# Patient Record
Sex: Female | Born: 1975 | Race: Black or African American | Hispanic: No | Marital: Married | Smoking: Current some day smoker
Health system: Southern US, Community
[De-identification: ages and names within clinical notes are randomized; demographics above are authoritative.]

## PROBLEM LIST (undated history)

## (undated) DIAGNOSIS — K219 Gastro-esophageal reflux disease without esophagitis: Secondary | ICD-10-CM

## (undated) DIAGNOSIS — M549 Dorsalgia, unspecified: Secondary | ICD-10-CM

## (undated) DIAGNOSIS — S82892A Other fracture of left lower leg, initial encounter for closed fracture: Secondary | ICD-10-CM

## (undated) HISTORY — PX: UPPER GI ENDOSCOPY: SHX6162

## (undated) HISTORY — PX: TUBAL LIGATION: SHX77

## (undated) HISTORY — PX: SHOULDER SURGERY: SHX246

## (undated) HISTORY — PX: ABDOMINAL EXPLORATION SURGERY: SHX538

---

## 1999-06-25 ENCOUNTER — Inpatient Hospital Stay (HOSPITAL_COMMUNITY): Admission: AD | Admit: 1999-06-25 | Discharge: 1999-06-25 | Payer: Self-pay | Admitting: *Deleted

## 1999-06-26 ENCOUNTER — Inpatient Hospital Stay (HOSPITAL_COMMUNITY): Admission: AD | Admit: 1999-06-26 | Discharge: 1999-06-26 | Payer: Self-pay | Admitting: *Deleted

## 2000-01-22 ENCOUNTER — Inpatient Hospital Stay (HOSPITAL_COMMUNITY): Admission: AD | Admit: 2000-01-22 | Discharge: 2000-01-22 | Payer: Self-pay | Admitting: *Deleted

## 2000-11-04 ENCOUNTER — Encounter: Payer: Self-pay | Admitting: Internal Medicine

## 2001-01-12 ENCOUNTER — Other Ambulatory Visit: Admission: RE | Admit: 2001-01-12 | Discharge: 2001-01-12 | Payer: Self-pay | Admitting: Obstetrics and Gynecology

## 2002-07-09 ENCOUNTER — Inpatient Hospital Stay (HOSPITAL_COMMUNITY): Admission: AD | Admit: 2002-07-09 | Discharge: 2002-07-09 | Payer: Self-pay | Admitting: Family Medicine

## 2002-12-21 ENCOUNTER — Emergency Department (HOSPITAL_COMMUNITY): Admission: EM | Admit: 2002-12-21 | Discharge: 2002-12-21 | Payer: Self-pay | Admitting: *Deleted

## 2004-04-28 ENCOUNTER — Emergency Department (HOSPITAL_COMMUNITY): Admission: EM | Admit: 2004-04-28 | Discharge: 2004-04-28 | Payer: Self-pay | Admitting: Emergency Medicine

## 2004-06-18 ENCOUNTER — Encounter: Payer: Self-pay | Admitting: Internal Medicine

## 2004-12-25 ENCOUNTER — Other Ambulatory Visit: Admission: RE | Admit: 2004-12-25 | Discharge: 2004-12-25 | Payer: Self-pay | Admitting: Gynecology

## 2005-07-05 ENCOUNTER — Emergency Department (HOSPITAL_COMMUNITY): Admission: EM | Admit: 2005-07-05 | Discharge: 2005-07-05 | Payer: Self-pay | Admitting: Family Medicine

## 2006-01-20 ENCOUNTER — Emergency Department (HOSPITAL_COMMUNITY): Admission: EM | Admit: 2006-01-20 | Discharge: 2006-01-20 | Payer: Self-pay | Admitting: Emergency Medicine

## 2006-05-14 ENCOUNTER — Other Ambulatory Visit: Admission: RE | Admit: 2006-05-14 | Discharge: 2006-05-14 | Payer: Self-pay | Admitting: Gynecology

## 2006-08-20 ENCOUNTER — Ambulatory Visit: Payer: Self-pay | Admitting: Family Medicine

## 2007-02-05 ENCOUNTER — Emergency Department (HOSPITAL_COMMUNITY): Admission: EM | Admit: 2007-02-05 | Discharge: 2007-02-05 | Payer: Self-pay | Admitting: Emergency Medicine

## 2007-06-03 ENCOUNTER — Other Ambulatory Visit: Admission: RE | Admit: 2007-06-03 | Discharge: 2007-06-03 | Payer: Self-pay | Admitting: Obstetrics and Gynecology

## 2007-06-17 ENCOUNTER — Ambulatory Visit (HOSPITAL_COMMUNITY): Admission: RE | Admit: 2007-06-17 | Discharge: 2007-06-17 | Payer: Self-pay | Admitting: Obstetrics and Gynecology

## 2007-12-30 ENCOUNTER — Ambulatory Visit: Payer: Self-pay | Admitting: Family Medicine

## 2007-12-30 ENCOUNTER — Encounter (INDEPENDENT_AMBULATORY_CARE_PROVIDER_SITE_OTHER): Payer: Self-pay | Admitting: Family Medicine

## 2007-12-30 ENCOUNTER — Encounter (INDEPENDENT_AMBULATORY_CARE_PROVIDER_SITE_OTHER): Payer: Self-pay | Admitting: *Deleted

## 2007-12-30 DIAGNOSIS — K219 Gastro-esophageal reflux disease without esophagitis: Secondary | ICD-10-CM | POA: Insufficient documentation

## 2007-12-30 LAB — CONVERTED CEMR LAB
Basophils Absolute: 0 10*3/uL (ref 0.0–0.1)
Basophils Relative: 0.4 % (ref 0.0–1.0)
Direct LDL: 131.7 mg/dL
Eosinophils Absolute: 0.2 10*3/uL (ref 0.0–0.6)
Eosinophils Relative: 2.3 % (ref 0.0–5.0)
HCT: 42.9 % (ref 36.0–46.0)
Hemoglobin: 14.1 g/dL (ref 12.0–15.0)
MCHC: 32.9 g/dL (ref 30.0–36.0)
RBC: 4.54 M/uL (ref 3.87–5.11)
RDW: 11.8 % (ref 11.5–14.6)
TSH: 1.26 microintl units/mL (ref 0.35–5.50)
Total CHOL/HDL Ratio: 4.3
Triglycerides: 70 mg/dL (ref 0–149)
VLDL: 14 mg/dL (ref 0–40)
WBC: 6.6 10*3/uL (ref 4.5–10.5)

## 2008-01-05 ENCOUNTER — Telehealth (INDEPENDENT_AMBULATORY_CARE_PROVIDER_SITE_OTHER): Payer: Self-pay | Admitting: *Deleted

## 2008-01-11 ENCOUNTER — Other Ambulatory Visit: Admission: RE | Admit: 2008-01-11 | Discharge: 2008-01-11 | Payer: Self-pay | Admitting: Family Medicine

## 2008-01-11 ENCOUNTER — Encounter (INDEPENDENT_AMBULATORY_CARE_PROVIDER_SITE_OTHER): Payer: Self-pay | Admitting: Family Medicine

## 2008-01-11 ENCOUNTER — Ambulatory Visit: Payer: Self-pay | Admitting: Family Medicine

## 2008-01-11 DIAGNOSIS — R1310 Dysphagia, unspecified: Secondary | ICD-10-CM | POA: Insufficient documentation

## 2008-01-13 ENCOUNTER — Telehealth (INDEPENDENT_AMBULATORY_CARE_PROVIDER_SITE_OTHER): Payer: Self-pay | Admitting: *Deleted

## 2008-01-29 ENCOUNTER — Emergency Department (HOSPITAL_COMMUNITY): Admission: EM | Admit: 2008-01-29 | Discharge: 2008-01-29 | Payer: Self-pay | Admitting: Family Medicine

## 2008-02-10 ENCOUNTER — Ambulatory Visit: Payer: Self-pay | Admitting: Internal Medicine

## 2008-04-12 ENCOUNTER — Ambulatory Visit: Payer: Self-pay | Admitting: Family Medicine

## 2008-04-26 DIAGNOSIS — F17200 Nicotine dependence, unspecified, uncomplicated: Secondary | ICD-10-CM

## 2008-04-26 DIAGNOSIS — E785 Hyperlipidemia, unspecified: Secondary | ICD-10-CM

## 2008-04-27 ENCOUNTER — Ambulatory Visit: Payer: Self-pay | Admitting: Internal Medicine

## 2008-05-03 ENCOUNTER — Telehealth (INDEPENDENT_AMBULATORY_CARE_PROVIDER_SITE_OTHER): Payer: Self-pay | Admitting: *Deleted

## 2008-05-09 ENCOUNTER — Telehealth (INDEPENDENT_AMBULATORY_CARE_PROVIDER_SITE_OTHER): Payer: Self-pay | Admitting: *Deleted

## 2008-05-10 ENCOUNTER — Ambulatory Visit: Payer: Self-pay | Admitting: Internal Medicine

## 2008-05-10 ENCOUNTER — Encounter: Payer: Self-pay | Admitting: Family Medicine

## 2008-05-10 DIAGNOSIS — J019 Acute sinusitis, unspecified: Secondary | ICD-10-CM

## 2008-05-16 ENCOUNTER — Telehealth (INDEPENDENT_AMBULATORY_CARE_PROVIDER_SITE_OTHER): Payer: Self-pay | Admitting: *Deleted

## 2008-05-19 ENCOUNTER — Telehealth (INDEPENDENT_AMBULATORY_CARE_PROVIDER_SITE_OTHER): Payer: Self-pay | Admitting: *Deleted

## 2008-07-29 ENCOUNTER — Ambulatory Visit: Payer: Self-pay | Admitting: Family Medicine

## 2008-07-29 ENCOUNTER — Encounter (INDEPENDENT_AMBULATORY_CARE_PROVIDER_SITE_OTHER): Payer: Self-pay | Admitting: *Deleted

## 2008-07-29 DIAGNOSIS — J029 Acute pharyngitis, unspecified: Secondary | ICD-10-CM

## 2008-07-30 ENCOUNTER — Inpatient Hospital Stay (HOSPITAL_COMMUNITY): Admission: AD | Admit: 2008-07-30 | Discharge: 2008-07-30 | Payer: Self-pay | Admitting: Obstetrics and Gynecology

## 2008-07-30 ENCOUNTER — Encounter: Payer: Self-pay | Admitting: Family Medicine

## 2008-08-02 ENCOUNTER — Encounter (INDEPENDENT_AMBULATORY_CARE_PROVIDER_SITE_OTHER): Payer: Self-pay | Admitting: *Deleted

## 2009-02-01 ENCOUNTER — Inpatient Hospital Stay (HOSPITAL_COMMUNITY): Admission: AD | Admit: 2009-02-01 | Discharge: 2009-02-04 | Payer: Self-pay | Admitting: Obstetrics & Gynecology

## 2009-02-06 ENCOUNTER — Inpatient Hospital Stay (HOSPITAL_COMMUNITY): Admission: AD | Admit: 2009-02-06 | Discharge: 2009-02-06 | Payer: Self-pay | Admitting: Obstetrics and Gynecology

## 2009-02-14 ENCOUNTER — Emergency Department (HOSPITAL_COMMUNITY): Admission: EM | Admit: 2009-02-14 | Discharge: 2009-02-15 | Payer: Self-pay | Admitting: Emergency Medicine

## 2009-07-03 ENCOUNTER — Emergency Department (HOSPITAL_COMMUNITY): Admission: EM | Admit: 2009-07-03 | Discharge: 2009-07-03 | Payer: Self-pay | Admitting: Emergency Medicine

## 2009-07-17 ENCOUNTER — Emergency Department (HOSPITAL_COMMUNITY): Admission: EM | Admit: 2009-07-17 | Discharge: 2009-07-18 | Payer: Self-pay | Admitting: Emergency Medicine

## 2009-12-22 ENCOUNTER — Emergency Department (HOSPITAL_COMMUNITY): Admission: EM | Admit: 2009-12-22 | Discharge: 2009-12-22 | Payer: Self-pay | Admitting: Emergency Medicine

## 2010-05-29 ENCOUNTER — Ambulatory Visit: Payer: Self-pay | Admitting: Nurse Practitioner

## 2010-05-29 ENCOUNTER — Inpatient Hospital Stay (HOSPITAL_COMMUNITY): Admission: AD | Admit: 2010-05-29 | Discharge: 2010-05-30 | Payer: Self-pay | Admitting: Obstetrics and Gynecology

## 2010-06-09 ENCOUNTER — Ambulatory Visit: Payer: Self-pay | Admitting: Nurse Practitioner

## 2010-06-09 ENCOUNTER — Inpatient Hospital Stay (HOSPITAL_COMMUNITY): Admission: AD | Admit: 2010-06-09 | Discharge: 2010-06-09 | Payer: Self-pay | Admitting: Obstetrics and Gynecology

## 2010-06-28 ENCOUNTER — Inpatient Hospital Stay (HOSPITAL_COMMUNITY): Admission: AD | Admit: 2010-06-28 | Discharge: 2010-06-28 | Payer: Self-pay | Admitting: Obstetrics and Gynecology

## 2010-12-12 ENCOUNTER — Inpatient Hospital Stay (HOSPITAL_COMMUNITY)
Admission: AD | Admit: 2010-12-12 | Discharge: 2010-12-12 | Payer: Self-pay | Source: Home / Self Care | Attending: Obstetrics and Gynecology | Admitting: Obstetrics and Gynecology

## 2010-12-25 LAB — CBC
HCT: 35.8 % — ABNORMAL LOW (ref 36.0–46.0)
Hemoglobin: 12.5 g/dL (ref 12.0–15.0)
MCHC: 34.9 g/dL (ref 30.0–36.0)
RBC: 3.85 MIL/uL — ABNORMAL LOW (ref 3.87–5.11)

## 2010-12-25 LAB — SURGICAL PCR SCREEN
MRSA, PCR: NEGATIVE
Staphylococcus aureus: NEGATIVE

## 2011-01-01 ENCOUNTER — Other Ambulatory Visit: Payer: Self-pay | Admitting: Obstetrics and Gynecology

## 2011-01-01 ENCOUNTER — Inpatient Hospital Stay (HOSPITAL_COMMUNITY)
Admission: RE | Admit: 2011-01-01 | Discharge: 2011-01-04 | DRG: 766 | Disposition: A | Discharge status: Home / Self Care | Payer: Medicaid Other | Source: Ambulatory Visit | Attending: Obstetrics and Gynecology | Admitting: Obstetrics and Gynecology

## 2011-01-01 DIAGNOSIS — O34219 Maternal care for unspecified type scar from previous cesarean delivery: Principal | ICD-10-CM | POA: Diagnosis present

## 2011-01-01 DIAGNOSIS — Z302 Encounter for sterilization: Secondary | ICD-10-CM

## 2011-01-01 DIAGNOSIS — O99893 Other specified diseases and conditions complicating puerperium: Secondary | ICD-10-CM | POA: Diagnosis not present

## 2011-01-01 DIAGNOSIS — R51 Headache: Secondary | ICD-10-CM | POA: Diagnosis not present

## 2011-01-02 LAB — CBC
MCH: 31.6 pg (ref 26.0–34.0)
MCV: 95.2 fL (ref 78.0–100.0)
Platelets: 148 10*3/uL — ABNORMAL LOW (ref 150–400)
RBC: 3.51 MIL/uL — ABNORMAL LOW (ref 3.87–5.11)

## 2011-01-17 NOTE — Discharge Summary (Signed)
  [name], [name]                  ACCOUNT NO.:  [id number]  MEDICAL RECORD NO.:  [id number]           PATIENT TYPE:  I  LOCATION:  9121                          [facility]  PHYSICIAN:  Malva Limes, M.D.    DATE OF BIRTH:  December 15, 1975  DATE OF ADMISSION:  01/01/2011 DATE OF DISCHARGE:  01/04/2011                              DISCHARGE SUMMARY   PRINCIPAL DISCHARGE DIAGNOSES: 1. Intrauterine pregnancy at term. 2. History of prior cesarean section. 3. The patient desires repeat cesarean section. 4. The patient desires permanent sterilization. 5. Possible spinal headache.  PRINCIPAL PROCEDURES: 1. Repeat low transverse cesarean section. 2. Bilateral tubal ligation. 3. Anesthesia consult.  HISTORY OF PRESENT ILLNESS:  [name] is a 35 year old black female, G2, P1, with a history of prior cesarean section.  She expressed a desire for repeat cesarean section and permanent sterilization.  This was performed on January 01, 2011.  Procedure was uncomplicated. Complete description of this can be found in dictated operative note. The pathology on the tubes is pending.  The patient's preop hemoglobin was 12.5, postop 11.1 during the postop.  The patient complained of headache.  She also had this with her first delivery.  An anesthesia consult was obtained.  It is felt that she possibly had a spinal headache and recommendations from Anesthesia were increasing fluids, increasing caffeine intake, resting supine, and Fioricet p.r.n.  At the day of discharge, the patient's headache was slightly improved.  She did not have a bowel movement prior to discharge.  She was eating without difficulty and passing flatus.  The patient was recommended to have Dulcolax suppository prior to discharge.  She was sent home with Percocet and Fioricet to take p.r.n.  She was instructed to follow up in the office in 4 weeks.  Her incision was healing well at this time of discharge.     ______________________________ Malva Limes, M.D.     MA/MEDQ  D:  01/04/2011  T:  01/04/2011  Job:  [id number]  Electronically Signed by Malva Limes M.D. on 01/17/2011 12:47:38 PM

## 2011-01-17 NOTE — Op Note (Signed)
NAMEJOLIE, Regina Simmons                  ACCOUNT NO.:  [id number]  MEDICAL RECORD NO.:  [id number]           PATIENT TYPE:  I  LOCATION:  9121                          [facility]  PHYSICIAN:  Malva Limes, M.D.    DATE OF BIRTH:  [birth year]  DATE OF PROCEDURE:  01/01/2011 DATE OF DISCHARGE:                              OPERATIVE REPORT   PREOPERATIVE DIAGNOSES: 1. Intrauterine pregnancy at term. 2. History of prior cesarean section. 3. The patient desires repeat cesarean section. 4. The patient desires permanent sterilization.  POSTOPERATIVE DIAGNOSES: 1. Intrauterine pregnancy at term. 2. History of prior cesarean section. 3. The patient desires repeat cesarean section. 4. The patient desires permanent sterilization.  PROCEDURES: 1. Repeat low transverse cesarean section. 2. Bilateral tubal ligation, Pomeroy procedure.  SURGEON:  Malva Limes, MD  ASSISTANT:  Luvenia Redden, MD  ANESTHESIA:  Spinal.  ANTIBIOTIC:  Gentamicin and clindamycin.  ESTIMATED BLOOD LOSS:  900 mL.  DRAINS:  Foley bedside drainage.  SPECIMENS:  Portion of right and left fallopian tube sent to Pathology.  COMPLICATIONS:  None.  INDICATIONS:  Regina Simmons is a 35 year old black female G2, P1, at 80 weeks' estimated gestational age who expressed desire for repeat cesarean section and bilateral tubal ligation.  The patient's prenatal care was uncomplicated prior to having a procedure performed.  The patient expressed her understanding of the intended permanence of the procedure, the possible failure rate and possible change in her menstrual cycles.  PROCEDURE:  The patient was taken to the operating room where spinal anesthetic was administered without difficulty.  She was then placed in the dorsal supine position with left lateral tilt.  She was prepped and draped in the usual fashion for this procedure.  A Foley catheter was placed in her bladder.  A Pfannenstiel incision was made through  the previous scar.  On entering the abdominal cavity, the bladder flap was taken down with sharp dissection.  A low-transverse uterine incision was made in the midline and extended laterally with blunt dissection. Amniotic sac was entered.  The fluid was clear.  The infant was delivered in the vertex presentation with vacuum extractor.  On delivery of the head, the oropharynx and nostrils were bulb suctioned.  The cord doubly clamped and cut and the infant handed to the awaiting NICU team. Placenta was then manually removed.  The uterus was exteriorized. Uterine cavity was examined and wiped with wet lap.  The uterine incision was closed in a single layer of 0 Monocryl suture in a running locking fashion.  The right fallopian tube was then grasped in the isthmic portion.  A 3-cm knuckle of fallopian tube was then doubly tied with 2-0 plain gut suture.  The knuckle was excised.  Both ostia were visualized.  Hemostasis appeared to be adequate.  A similar procedure was performed on the opposite side.  The uterus was placed back into the abdominal cavity.  Hemostasis again was checked and felt to be adequate. The bladder flap was closed using 2-0 Monocryl in a running fashion. The parietal peritoneum and rectus muscles were approximated in the midline  using 2-0 Monocryl in a running fashion.  The fascia was closed using 0 Monocryl suture in running fashion.  The subcuticular tissue was made hemostatic with a Bovie.  Stainless steel clips were used to close the skin.  The patient tolerated the procedure well.  She was taken to recovery room in stable condition.  Instrument and lap counts were correct x2.  The patient delivered one live viable black female infant, Apgars 9 at one minute and 9 at five minutes.  The weight was 7 pounds 7 ounces.          ______________________________ Malva Limes, M.D.     MA/MEDQ  D:  01/01/2011  T:  01/02/2011  Job:  [id number]  Electronically Signed by  Malva Limes M.D. on 01/17/2011 12:47:43 PM

## 2011-02-08 LAB — GLUCOSE, CAPILLARY: Glucose-Capillary: 86 mg/dL (ref 70–99)

## 2011-02-08 LAB — URINALYSIS, ROUTINE W REFLEX MICROSCOPIC
Bilirubin Urine: NEGATIVE
Glucose, UA: 250 mg/dL — AB
Ketones, ur: 15 mg/dL — AB
Nitrite: NEGATIVE
Urobilinogen, UA: 1 mg/dL (ref 0.0–1.0)

## 2011-02-08 LAB — URINE MICROSCOPIC-ADD ON

## 2011-02-09 LAB — URINALYSIS, ROUTINE W REFLEX MICROSCOPIC
Ketones, ur: NEGATIVE mg/dL
Nitrite: NEGATIVE
Protein, ur: NEGATIVE mg/dL
pH: 5.5 (ref 5.0–8.0)

## 2011-02-09 LAB — WET PREP, GENITAL: Yeast Wet Prep HPF POC: NONE SEEN

## 2011-02-09 LAB — GC/CHLAMYDIA PROBE AMP, GENITAL
Chlamydia, DNA Probe: NEGATIVE
GC Probe Amp, Genital: NEGATIVE

## 2011-02-10 LAB — CBC
HCT: 37.8 % (ref 36.0–46.0)
MCH: 33.2 pg (ref 26.0–34.0)
MCV: 96.6 fL (ref 78.0–100.0)
RDW: 12.6 % (ref 11.5–15.5)
WBC: 9.4 10*3/uL (ref 4.0–10.5)

## 2011-02-10 LAB — HCG, QUANTITATIVE, PREGNANCY: hCG, Beta Chain, Quant, S: 143537 m[IU]/mL — ABNORMAL HIGH (ref <5)

## 2011-02-10 LAB — WET PREP, GENITAL
Trich, Wet Prep: NONE SEEN
Yeast Wet Prep HPF POC: NONE SEEN

## 2011-02-10 LAB — URINALYSIS, ROUTINE W REFLEX MICROSCOPIC
Bilirubin Urine: NEGATIVE
Hgb urine dipstick: NEGATIVE
Ketones, ur: NEGATIVE mg/dL
Nitrite: NEGATIVE
Protein, ur: NEGATIVE mg/dL
Specific Gravity, Urine: 1.025 (ref 1.005–1.030)
Urobilinogen, UA: 1 mg/dL (ref 0.0–1.0)

## 2011-02-10 LAB — GC/CHLAMYDIA PROBE AMP, GENITAL: GC Probe Amp, Genital: NEGATIVE

## 2011-02-12 ENCOUNTER — Other Ambulatory Visit: Payer: Self-pay | Admitting: Obstetrics and Gynecology

## 2011-03-02 LAB — URINE MICROSCOPIC-ADD ON

## 2011-03-02 LAB — BASIC METABOLIC PANEL
BUN: 13 mg/dL (ref 6–23)
Calcium: 9.4 mg/dL (ref 8.4–10.5)
GFR calc non Af Amer: >60 mL/min (ref >60)
Glucose, Bld: 89 mg/dL (ref 70–99)

## 2011-03-02 LAB — URINALYSIS, ROUTINE W REFLEX MICROSCOPIC
Glucose, UA: NEGATIVE mg/dL
Ketones, ur: 15 mg/dL — AB
Leukocytes, UA: NEGATIVE
Nitrite: NEGATIVE
Specific Gravity, Urine: 1.039 — ABNORMAL HIGH (ref 1.005–1.030)
pH: 5.5 (ref 5.0–8.0)

## 2011-03-02 LAB — CBC
Platelets: 276 10*3/uL (ref 150–400)
RDW: 13.1 % (ref 11.5–15.5)
WBC: 9.9 10*3/uL (ref 4.0–10.5)

## 2011-03-02 LAB — DIFFERENTIAL
Basophils Absolute: 0 10*3/uL (ref 0.0–0.1)
Lymphocytes Relative: 26 % (ref 12–46)
Lymphs Abs: 2.6 10*3/uL (ref 0.7–4.0)
Neutro Abs: 5.9 10*3/uL (ref 1.7–7.7)
Neutrophils Relative %: 59 % (ref 43–77)

## 2011-03-02 LAB — POCT PREGNANCY, URINE: Preg Test, Ur: NEGATIVE

## 2011-03-02 LAB — RAPID STREP SCREEN (MED CTR MEBANE ONLY): Streptococcus, Group A Screen (Direct): NEGATIVE

## 2011-03-07 LAB — COMPREHENSIVE METABOLIC PANEL
Alkaline Phosphatase: 76 U/L (ref 39–117)
BUN: 8 mg/dL (ref 6–23)
Creatinine, Ser: 0.68 mg/dL (ref 0.4–1.2)
Glucose, Bld: 95 mg/dL (ref 70–99)
Potassium: 3.8 mEq/L (ref 3.5–5.1)
Total Bilirubin: 0.3 mg/dL (ref 0.3–1.2)
Total Protein: 5.2 g/dL — ABNORMAL LOW (ref 6.0–8.3)

## 2011-03-07 LAB — CBC
HCT: 25.4 % — ABNORMAL LOW (ref 36.0–46.0)
HCT: 26.9 % — ABNORMAL LOW (ref 36.0–46.0)
HCT: 37.5 % (ref 36.0–46.0)
Hemoglobin: 12.6 g/dL (ref 12.0–15.0)
Hemoglobin: 8.7 g/dL — ABNORMAL LOW (ref 12.0–15.0)
Hemoglobin: 9.2 g/dL — ABNORMAL LOW (ref 12.0–15.0)
MCV: 97.3 fL (ref 78.0–100.0)
Platelets: 162 10*3/uL (ref 150–400)
RBC: 2.73 MIL/uL — ABNORMAL LOW (ref 3.87–5.11)
RBC: 3.82 MIL/uL — ABNORMAL LOW (ref 3.87–5.11)
RDW: 12.6 % (ref 11.5–15.5)
RDW: 13.3 % (ref 11.5–15.5)
WBC: 12.4 10*3/uL — ABNORMAL HIGH (ref 4.0–10.5)
WBC: 17.8 10*3/uL — ABNORMAL HIGH (ref 4.0–10.5)

## 2011-03-07 LAB — URIC ACID: Uric Acid, Serum: 6.1 mg/dL (ref 2.4–7.0)

## 2011-03-07 LAB — LACTATE DEHYDROGENASE: LDH: 183 U/L (ref 94–250)

## 2011-03-07 LAB — CCBB MATERNAL DONOR DRAW

## 2011-04-09 NOTE — Op Note (Signed)
Regina Simmons, Regina Simmons                  ACCOUNT NO.:  [id number]   MEDICAL RECORD NO.:  [id number]          PATIENT TYPE:  INP   LOCATION:  9133                          [facility]   PHYSICIAN:  Malva Limes, M.D.    DATE OF BIRTH:  Feb 21, 1976   DATE OF PROCEDURE:  02/01/2009  DATE OF DISCHARGE:                               OPERATIVE REPORT   PREOPERATIVE DIAGNOSES:  1. Intrauterine pregnancy at term.  2. Failure to progress.   POSTOPERATIVE DIAGNOSES:  1. Intrauterine pregnancy at term.  2. Failure to progress.   PROCEDURE:  Primary low transverse cesarean section.   SURGEON:  Malva Limes, MD   ANESTHESIA:  Spinal.   ANTIBIOTIC:  Ancef 1 g.   DRAINS:  Foley bedside drainage.   ESTIMATED BLOOD LOSS:  900 mL.   SPECIMENS:  None.   COMPLICATIONS:  None.   FINDINGS:  The patient had normal fallopian tubes bilaterally.  She had  approximately 3-4 uterine leiomyomata, largest measuring approximately  2.5-3 cm.  These were scattered about the uterus.   PROCEDURE:  The patient was taken to the operating room where an  adequate level was reached by anesthesia.  Once this was accomplished,  the patient was prepped and draped in the usual fashion for this  procedure.  A Pfannenstiel incision was made approximately 2 cm above  the pubic symphysis.  On entering the abdominal cavity, the bladder flap  was taken down sharply.  A low transverse uterine incision was made in  the midline and extended laterally with blunt dissection.  Amniotic  fluid was noted to be clear.  The infant was delivered in the vertex  presentation.  The oropharynx and nostrils were bulb suctioned on  delivery of the head.  The remaining infant was then delivered.  The  cord was doubly clamped and cut and the infant handed to awaiting NICU  team.  Placenta was manually removed.  The uterus was exteriorized.  The  uterine cavity was cleaned with wet lap and inspected.  The uterine  incision was closed in  a single layer of 0 Monocryl suture in a running  locking fashion.  There was an area of bleeding at the right corner  which was made hemostatic with interrupted 0 Monocryl suture in a figure-  of-eight fashion.  Bladder flap was closed using 0 Monocryl suture in a  running fashion.  The uterus was placed back in the abdominal cavity.  Hemostasis was again checked and felt to be adequate.  The parietal  peritoneum and rectus muscles were approximated in the midline with 2-0  Monocryl in a running fashion.  The fascia was closed using 0  Monocryl suture in a running fashion.  Subcuticular tissue was made  hemostatic with Bovie.  Stainless steel clips were used to close the  skin.  The patient tolerated the procedure well.  She was taken to the  recovery room in stable condition.  Instrument and lap counts were  correct x2.           ______________________________  Malva Limes, M.D.  MA/MEDQ  D:  02/01/2009  T:  02/02/2009  Job:  [id number]

## 2011-04-09 NOTE — H&P (Signed)
NAME:  Regina Simmons, Regina Simmons                  ACCOUNT NO.:  [id number]   MEDICAL RECORD NO.:  [id number]          PATIENT TYPE:  AMB   LOCATION:  SDC                           [facility]   PHYSICIAN:  James A. Ashley Royalty, M.D.DATE OF BIRTH:  1976-02-27   DATE OF ADMISSION:  06/17/2007  DATE OF DISCHARGE:                              HISTORY & PHYSICAL   This is a 35 year old nulligravida who presented to me June 03, 2007,  complaining of lower abdominal pain, right greater than left.  Onset was  4 years ago.  The pain was intermittent.  It seems to last less than an  hour at a time.  There were no associated symptoms.  She also complains  of pain on the same side during sexual activity.  She has a long history  of moderate dysmenorrhea.  She has also had unprotected intercourse for  greater than one year without conception.  She states she has a history  of pelvic inflammatory disease at or about 35 years of age.  She was  documented as having chlamydia, which was treated.  The patient states  the discomfort is sufficiently debilitating to warrant surgical  intervention.   MEDICATIONS:  Protonix.   PAST MEDICAL HISTORY:  Medical:  Left shoulder surgery.   ALLERGIES:  None disclosed.   SOCIAL HISTORY:  The patient smokes approximately 3 cigarettes per day.  She denies significant use of alcohol.   REVIEW OF SYSTEMS:  Noncontributory.   PHYSICAL EXAMINATION:  A well-developed, well-nourished, pleasant female  in no acute distress.  Afebrile.  Vital signs stable.  CHEST:  Lungs are clear.  CARDIAC:  Regular rate and rhythm.  ABDOMEN:  Soft and nontender.  PELVIC:  Please see most recent examination.  External genitalia within  normal limits.  Vagina and cervix are without gross lesions.  Bimanual  examination reveals the uterus to be normal size and shape except for  nodularity in the rectovaginal septum versus posterior aspect of the  uterus.   IMPRESSION:  1. Right lower quadrant  pain of 4 years' duration, intermittent.      Differential includes endometriosis, primary gastrointestinal,      adnexal pathology, musculoskeletal, etc.  2. Dyspareunia.  Differential includes adhesions, endometriosis.  3. Smoker.   PLAN:  Diagnostic/operative laparoscopy.  The risks, benefits,  complications and alternatives were fully discussed with the patient.  The possibility of unilateral salpingo-oophorectomy discussed and  accepted.  The possibility of exploratory laparotomy discussed and  accepted.  Questions invited and answered.      James A. Ashley Royalty, M.D.  Electronically Signed     JAM/MEDQ  D:  06/17/2007  T:  06/17/2007  Job:  [id number]

## 2011-04-09 NOTE — Letter (Signed)
April 27, 2008    Regina Simmons  [address]  [place], [place]  [address]   RE:  Regina Simmons, Regina Simmons  MRN:  [id number]  /  DOB:  1975/12/29   Dear Ms. Bortner,   Due to multiple no-shows for gastroenterology appointments, we find it  necessary to discharge you from our practice.  Though we would like to  provide your care, not showing for office appointments without notice  serves as a disruption to our practice and inhibits others, who wish to  be seen, from access.  We are available for emergencies over the next 30  days.  Thereafter, it is your responsibility to secure a specialist for  your gastroenterology needs.   For any questions, please contact our Merchant navy officer, Dellia Beckwith.    Sincerely,      Wilhemina Bonito. Marina Goodell, MD  Electronically Signed    JNP/MedQ  DD: 04/27/2008  DT: 04/27/2008  Job #: [id number]

## 2011-04-09 NOTE — Op Note (Signed)
NAME:  DELL, Regina Simmons                  ACCOUNT NO.:  [id number]   MEDICAL RECORD NO.:  [id number]          PATIENT TYPE:  AMB   LOCATION:  SDC                           [facility]   PHYSICIAN:  James A. Ashley Royalty, M.D.DATE OF BIRTH:  [birth year]   DATE OF PROCEDURE:  06/17/2007  DATE OF DISCHARGE:                               OPERATIVE REPORT   PREOPERATIVE DIAGNOSIS:  1. Pelvic pain.  2. Dyspareunia.   POSTOPERATIVE DIAGNOSIS:  1. Fibroid uterus.  2. Adhesions of colon to right pelvic sidewall.  3. No evidence of tubal obstruction.   PROCEDURE:  1. Diagnosis/operative laparoscopy.  2. Lysis of adhesions.  3. Chromopertubation of the oviducts.   SURGEON:  Sylvester Harder, M.D.   ANESTHESIA:  General   ESTIMATED BLOOD LOSS:  Less than 25 mL.   COMPLICATIONS:  None.   PACKS AND DRAINS:  None.   PROCEDURE:  The patient taken to the operating room, placed in dorsal  supine position.  After general anesthetic was administered she was  placed in the lithotomy position and prepped and draped in usual manner  for abdominal and vaginal surgery.  Posterior weighted retractor was  placed per vagina.  The anterior lip of the cervix grasped with single-  tooth tenaculum.  Jarcho uterine manipulator was placed per cervix and  held in place with tenaculum.  The bladder was drained with an in-and-  out catheter.   Next, a 1.2 cm infraumbilical incision was made a longitudinal plane.  Veress needle was placed into the abdominal cavity and its location  verified by instillation of saline and hanging drop techniques.  Approximately 2.5 liters of CO2 were instilled at 1 liter per minute to  create pneumoperitoneum.  Next the size 10/11 disposable laparoscopic  trocar was placed into the abdominal cavity.  Location was verified by  placement of laparoscope.  There is no evidence of any trauma.  Pneumoperitoneum was maintained throughout with carbon dioxide.  The  pelvis was examined with  the patient in Trendelenburg.  Examination of  the uterus revealed two fibroids emanating from the posterior aspect of  the top of the fundus as well as the posterior aspect of the uterine  corpus.  The fundal fibroid that appeared to be approximately 2 cm in  greatest diameter.  The other one which was directly posterior on the  uterine corpus was approximately 1.5 cm in greatest diameter.  The right  ovary was normal size, shape and contour without evidence of any cysts  or endometriosis.  The largest diameter was approximately 3 cm.  The  left ovary was normal size, shape and contour without evidence of any  cysts or endometriosis.  The largest diameter was approximately 2.5 cm.  The fallopian tubes were normal size, shape and contour and length with  luxuriant fimbria.  The anterior posterior cul-de-sacs were  unremarkable.  Examination of the right aspect of the pelvic sidewall  revealed the colon to be adherent on the right side.  The remainder of  the peritoneal surfaces were smooth and glistening.  Appropriate  photographs were  obtained.   Using the bipolar cautery the adhesion of the colon to the right pelvic  sidewall was cauterized and lysed successfully.   Next chromopertubation of the oviducts was accomplished with a dilute  indigo carmine injected directly into the cervix.  Both tubes spilled  appropriately.  Appropriate photographs were obtained.   At this point the patient is felt to have benefited maximally from the  surgical procedure.  The abdominal instruments removed and  pneumoperitoneum evacuated.  Fascial defects closed with 0 Vicryl  interrupted fashion.  The skin was closed with 3-0 Monocryl in  subcuticular fashion.  Approximately 10 mL of 0.5% Marcaine with  1:200,000 epinephrine were instilled into the incisions to aid in  postoperative analgesia.   The vaginal instruments were removed.  Hemostasis noted.  The procedure  terminated.   The patient  tolerated procedure extremely well and was returned to the  recovery room in good condition.      James A. Ashley Royalty, M.D.  Electronically Signed     JAM/MEDQ  D:  06/17/2007  T:  06/17/2007  Job:  [id number]

## 2011-04-09 NOTE — Discharge Summary (Signed)
NAMEHATTIE, Regina Simmons                  ACCOUNT NO.:  [id number]   MEDICAL RECORD NO.:  [id number]          PATIENT TYPE:  INP   LOCATION:  9133                          [facility]   PHYSICIAN:  Gerrit Friends. Aldona Bar, M.D.   DATE OF BIRTH:  1976/07/15   DATE OF ADMISSION:  02/01/2009  DATE OF DISCHARGE:  02/04/2009                               DISCHARGE SUMMARY   DISCHARGE DIAGNOSES:  1. Term pregnancy, delivered 7 pounds 14 ounces female infant, Apgars      9 and 09.  2. Blood type O positive.  3. Failure to progress.   PROCEDURE:  Primary low-transverse cesarean section.   SUMMARY:  This 35 year old primigravida was admitted at term in labor  after ruptured membranes.  Pregnancy was uncomplicated.  She was  followed and received Pitocin augmentation, but unfortunately failed to  progress beyond 5 cm of dilatation.  She was taken to the operating room  on the evening February 01, 2009, by Dr. Dareen Piano at which time she  underwent a primary low-transverse cesarean section without difficulty.  Her postop hemoglobin was 7.2 with a white count of 17,800 and platelet  count of 162,000.  She remained afebrile during her hospital course and  vital signs were stable as well.  On the morning of February 04, 2009, she  was ambulating well, tolerating a regular diet well, still working with  her breast feeding, was having normal bowel and bladder function, was  afebrile, and was ready for discharge.  Accordingly, the staples were  removed and the wound was Steri-Stripped with Benzoin.  She was given  all appropriate instructions per discharge brochure and understood all  instructions well.   DISCHARGE MEDICATIONS:  1. Vitamins 1 a day.  2. Feosol capsules - 1 daily.  3. Tylox one to two, 4-6 hours as needed for severe pain.  4. Motrin 600 mg every 6 hours for less severe pain.   She will return to the office for followup in approximately 4 weeks'  time or as needed.   CONDITION ON DISCHARGE:   Improved.      Gerrit Friends. Aldona Bar, M.D.  Electronically Signed     RMW/MEDQ  D:  02/04/2009  T:  02/04/2009  Job:  [id number]

## 2011-09-09 LAB — TYPE AND SCREEN
ABO/RH(D): O POS
Antibody Screen: NEGATIVE

## 2011-09-09 LAB — DIFFERENTIAL
Basophils Absolute: 0
Lymphocytes Relative: 49 — ABNORMAL HIGH
Neutro Abs: 2.4

## 2011-09-09 LAB — HCG, SERUM, QUALITATIVE: Preg, Serum: NEGATIVE

## 2011-09-09 LAB — CBC
Hemoglobin: 13.7
Platelets: 327
RDW: 13.1

## 2011-09-09 LAB — ABO/RH: ABO/RH(D): O POS

## 2011-09-26 ENCOUNTER — Ambulatory Visit: Payer: Medicaid Other | Admitting: Physical Therapy

## 2011-10-10 ENCOUNTER — Ambulatory Visit: Payer: Medicaid Other | Attending: Orthopaedic Surgery | Admitting: Physical Therapy

## 2011-10-10 DIAGNOSIS — M25559 Pain in unspecified hip: Secondary | ICD-10-CM | POA: Insufficient documentation

## 2011-10-10 DIAGNOSIS — M545 Low back pain, unspecified: Secondary | ICD-10-CM | POA: Insufficient documentation

## 2011-10-10 DIAGNOSIS — IMO0001 Reserved for inherently not codable concepts without codable children: Secondary | ICD-10-CM | POA: Insufficient documentation

## 2011-10-23 ENCOUNTER — Ambulatory Visit: Payer: Medicaid Other | Admitting: Physical Therapy

## 2011-10-25 ENCOUNTER — Ambulatory Visit: Payer: Medicaid Other | Admitting: Physical Therapy

## 2011-10-29 ENCOUNTER — Ambulatory Visit: Payer: Medicaid Other | Attending: Orthopaedic Surgery | Admitting: Physical Therapy

## 2011-10-29 DIAGNOSIS — M25559 Pain in unspecified hip: Secondary | ICD-10-CM | POA: Insufficient documentation

## 2011-10-29 DIAGNOSIS — IMO0001 Reserved for inherently not codable concepts without codable children: Secondary | ICD-10-CM | POA: Insufficient documentation

## 2011-10-29 DIAGNOSIS — M545 Low back pain, unspecified: Secondary | ICD-10-CM | POA: Insufficient documentation

## 2011-11-05 ENCOUNTER — Ambulatory Visit: Payer: Medicaid Other | Admitting: Physical Therapy

## 2011-11-12 ENCOUNTER — Encounter: Payer: Medicaid Other | Admitting: Physical Therapy

## 2012-10-29 ENCOUNTER — Encounter (HOSPITAL_COMMUNITY): Payer: Self-pay | Admitting: Emergency Medicine

## 2012-10-29 ENCOUNTER — Emergency Department (HOSPITAL_COMMUNITY)
Admission: EM | Admit: 2012-10-29 | Discharge: 2012-10-29 | Disposition: A | Discharge status: Home / Self Care | Payer: Medicaid Other | Attending: Emergency Medicine | Admitting: Emergency Medicine

## 2012-10-29 DIAGNOSIS — S93409A Sprain of unspecified ligament of unspecified ankle, initial encounter: Secondary | ICD-10-CM

## 2012-10-29 DIAGNOSIS — Y929 Unspecified place or not applicable: Secondary | ICD-10-CM | POA: Insufficient documentation

## 2012-10-29 DIAGNOSIS — W010XXA Fall on same level from slipping, tripping and stumbling without subsequent striking against object, initial encounter: Secondary | ICD-10-CM | POA: Insufficient documentation

## 2012-10-29 DIAGNOSIS — Y9389 Activity, other specified: Secondary | ICD-10-CM | POA: Insufficient documentation

## 2012-10-29 HISTORY — DX: Dorsalgia, unspecified: M54.9

## 2012-10-29 MED ORDER — HYDROCODONE-ACETAMINOPHEN 5-325 MG PO TABS: ORAL_TABLET | ORAL | Status: DC

## 2012-10-29 MED ORDER — IBUPROFEN 400 MG PO TABS
600.0000 mg | ORAL_TABLET | Freq: Once | ORAL | Status: AC
  Administered 2012-10-29: 600 mg via ORAL
  Filled 2012-10-29: qty 1

## 2012-10-29 MED ORDER — IBUPROFEN 600 MG PO TABS: 600.0000 mg | ORAL_TABLET | Freq: Three times a day (TID) | ORAL | Status: DC

## 2012-10-29 NOTE — ED Notes (Signed)
Pt c/o left ankle pain, reports she twisted it while getting her daughter out of the car. Pt has full movement of foot and toes. Good distal pulse.  Pt has swelling to left lateral ankle. Ice pack applied to site.

## 2012-10-29 NOTE — Progress Notes (Signed)
Orthopedic Tech Progress Note Patient Details:  Regina Simmons 1976/11/11 [id number]  Ortho Devices Type of Ortho Device: ASO Ortho Device/Splint Location: LEFT ASO Ortho Device/Splint Interventions: Application   Cammer, Mickie Bail 10/29/2012, 11:49 AM

## 2012-10-29 NOTE — Discharge Instructions (Signed)
 Ankle Sprain An ankle sprain is an injury to the strong, fibrous tissues (ligaments) that hold the bones of your ankle joint together.  CAUSES An ankle sprain is usually caused by a fall or by twisting your ankle. Ankle sprains most commonly occur when you step on the outer edge of your foot, and your ankle turns inward. People who participate in sports are more prone to these types of injuries.  SYMPTOMS   Pain in your ankle. The pain may be present at rest or only when you are trying to stand or walk.  Swelling.  Bruising. Bruising may develop immediately or within 1 to 2 days after your injury.  Difficulty standing or walking, particularly when turning corners or changing directions. DIAGNOSIS  Your caregiver will ask you details about your injury and perform a physical exam of your ankle to determine if you have an ankle sprain. During the physical exam, your caregiver will press on and apply pressure to specific areas of your foot and ankle. Your caregiver will try to move your ankle in certain ways. An X-ray exam may be done to be sure a bone was not broken or a ligament did not separate from one of the bones in your ankle (avulsion fracture).  TREATMENT  Certain types of braces can help stabilize your ankle. Your caregiver can make a recommendation for this. Your caregiver may recommend the use of medicine for pain. If your sprain is severe, your caregiver may refer you to a surgeon who helps to restore function to parts of your skeletal system (orthopedist) or a physical therapist. HOME CARE INSTRUCTIONS   Apply ice to your injury for 1 to 2 days or as directed by your caregiver. Applying ice helps to reduce inflammation and pain.  Put ice in a plastic bag.  Place a towel between your skin and the bag.  Leave the ice on for 15 to 20 minutes at a time, every 2 hours while you are awake.  Only take over-the-counter or prescription medicines for pain, discomfort, or fever as directed  by your caregiver.  Keep your injured leg elevated, when possible, to lessen swelling.  If your caregiver recommends crutches, use them as instructed. Gradually put weight on the affected ankle. Continue to use crutches or a cane until you can walk without feeling pain in your ankle.  If you have a plaster splint, wear the splint as directed by your caregiver. Do not rest it on anything harder than a pillow for the first 24 hours. Do not put weight on it. Do not get it wet. You may take it off to take a shower or bath.  You may have been given an elastic bandage to wear around your ankle to provide support. If the elastic bandage is too tight (you have numbness or tingling in your foot or your foot becomes cold and blue), adjust the bandage to make it comfortable.  If you have an air splint, you may blow more air into it or let air out to make it more comfortable. You may take your splint off at night and before taking a shower or bath.  Wiggle your toes in the splint several times per day to decrease swelling. SEEK MEDICAL CARE IF:   You have an increase in bruising, swelling, or pain.  Your toes feel extremely cold or you lose feeling in your foot.  Your pain is not relieved with medicine. SEEK IMMEDIATE MEDICAL CARE IF:  Your toes are numb or blue.  You have severe pain. MAKE SURE YOU:   Understand these instructions.  Will watch your condition.  Will get help right away if you are not doing well or get worse. Document Released: 11/11/2005 Document Revised: 02/03/2012 Document Reviewed: 11/23/2011 [facility] Patient Information 2013 [place], [place].    Narcotic and benzodiazepine use may cause drowsiness, slowed breathing or dependence.  Please use with caution and do not drive, operate machinery or watch young children alone while taking them.  Taking combinations of these medications or drinking alcohol will potentiate these effects.

## 2012-10-29 NOTE — ED Notes (Signed)
Paged ortho 

## 2012-10-29 NOTE — ED Provider Notes (Addendum)
History   This chart was scribed for Gavin Pound. Oletta Lamas, MD by Sofie Rower, ED Scribe. The patient was seen in room TR05C/TR05C and the patient's care was started at 11:11AM.     CSN: [id number]  Arrival date & time 10/29/12  1009   First MD Initiated Contact with Patient 10/29/12 1111      Chief Complaint  Patient presents with  . Ankle Pain    (Consider location/radiation/quality/duration/timing/severity/associated sxs/prior treatment) The history is provided by the patient. No language interpreter was used.    Regina Simmons is a 36 y.o. female who presents to the Emergency Department complaining of sudden, progressively worsening, ankle pain located at the left ankle , onset yesterday (10/28/12).  Associated symptoms include swelling located at the left ankle. The pt reports she was helping her child out of the car yesterday, where she suddenly, slipped, fell, and landed upon her left ankle abnormally. The pt was able to ambulate with difficulty after the incident. Modifying factors include certain movements and positions of the left ankle which intensifies the ankle pain, in addition to application of a cold compress which provides moderate relief of the ankle swelling.  PCP is Dr. Laury Axon.    Past Medical History  Diagnosis Date  . Back pain     History reviewed. No pertinent past surgical history.  History reviewed. No pertinent family history.  History  Substance Use Topics  . Smoking status: Not on file  . Smokeless tobacco: Not on file  . Alcohol Use:     OB History    Grav Para Term Preterm Abortions TAB SAB Ect Mult Living                  Review of Systems  Constitutional: Negative.   Musculoskeletal: Positive for arthralgias.  Skin: Negative for color change, rash and wound.  Neurological: Negative for weakness and numbness.    Allergies  Review of patient's allergies indicates no known allergies.  Home Medications   Current Outpatient Rx  Name  Route   Sig  Dispense  Refill  . HYDROCODONE-ACETAMINOPHEN 5-325 MG PO TABS   Oral   Take 1 tablet by mouth daily as needed. As needed for back pain.         Marland Kitchen PRENATAL 27-0.8 MG PO TABS   Oral   Take 1 tablet by mouth daily.         Marland Kitchen HYDROCODONE-ACETAMINOPHEN 5-325 MG PO TABS      1-2 tablets po q 6 hours prn moderate to severe pain   20 tablet   0   . IBUPROFEN 600 MG PO TABS   Oral   Take 1 tablet (600 mg total) by mouth 3 (three) times daily with meals.   21 tablet   0     BP 111/71  Pulse 75  Temp 98.7 F (37.1 C) (Oral)  Resp 18  Wt 155 lb (70.308 kg)  SpO2 96%  LMP 09/30/2012  Physical Exam  Nursing note and vitals reviewed. Constitutional: She appears well-developed and well-nourished.  HENT:  Head: Normocephalic and atraumatic.  Nose: Nose normal.  Pulmonary/Chest: Effort normal. No respiratory distress.  Musculoskeletal: She exhibits edema (Mild. ) and tenderness.       Left ankle: She exhibits decreased range of motion. She exhibits no swelling, no ecchymosis, no deformity, no laceration and normal pulse. tenderness. Lateral malleolus and medial malleolus tenderness found. No head of 5th metatarsal tenderness found. Achilles tendon normal.  Left foot: She exhibits normal capillary refill.       Left ankle: Swelling detected at the left ankle. No gross deformity. Mildly tender at the lateral and medial malleolus. No abrasions or lacerations. Achilles tendon is intact.   Neurological: She is alert. She exhibits normal muscle tone. Coordination normal.  Skin: Skin is warm and dry.    ED Course  Procedures (including critical care time)  DIAGNOSTIC STUDIES: Oxygen Saturation is 96% on room air, normal by my interpretation.    COORDINATION OF CARE:  11:21 AM- Treatment plan concerning of application of ankle brace, pain management, and management of swelling discussed with patient. Pt agrees with treatment.      Labs Reviewed - No data to  display No results found.   1. Ankle sprain       MDM  I personally performed the services described in this documentation, which was scribed in my presence. The recorded information has been reviewed and is accurate.   Pt with mechanical fall last night.  No skin abrasions, no ligamentous injury.  No sig deformity.  [place] ankle rules shows no xray needed.  Pt declines xray.    Gavin Pound. Oletta Lamas, MD 10/29/12 1128  Gavin Pound. Nasean Zapf, MD 10/29/12 1141

## 2012-10-29 NOTE — ED Notes (Signed)
Pt reports falling awkwardly on left ankle yesterday. States she continues to have pain. Moderate swelling to left ankle noted.

## 2012-11-17 ENCOUNTER — Encounter (HOSPITAL_COMMUNITY): Payer: Self-pay | Admitting: *Deleted

## 2012-11-17 ENCOUNTER — Emergency Department (HOSPITAL_COMMUNITY)
Admission: EM | Admit: 2012-11-17 | Discharge: 2012-11-17 | Disposition: A | Discharge status: Home / Self Care | Payer: Medicaid Other | Source: Home / Self Care | Attending: Emergency Medicine | Admitting: Emergency Medicine

## 2012-11-17 DIAGNOSIS — K645 Perianal venous thrombosis: Secondary | ICD-10-CM

## 2012-11-17 MED ORDER — HYDROCORTISONE 2.5 % RE CREA: TOPICAL_CREAM | Freq: Two times a day (BID) | RECTAL | Status: DC

## 2012-11-17 MED ORDER — POLYETHYLENE GLYCOL 3350 17 GM/SCOOP PO POWD: 17.0000 g | Freq: Two times a day (BID) | ORAL | Status: AC

## 2012-11-17 NOTE — ED Provider Notes (Addendum)
History     CSN: [id number]  Arrival date & time 11/17/12  1710   First MD Initiated Contact with Patient 11/17/12 1742      Chief Complaint  Patient presents with  . Hemorrhoids    (Consider location/radiation/quality/duration/timing/severity/associated sxs/prior treatment) HPI Comments: Patient presents urgent care tonight complaining of painful swollen hemorrhoid for about 4 days. They have become progressively worse swollen and tender. No I. can feel them much larger. Denies any bleeding  The history is provided by the patient.    Past Medical History  Diagnosis Date  . Back pain     History reviewed. No pertinent past surgical history.  No family history on file.  History  Substance Use Topics  . Smoking status: Current Every Day Smoker  . Smokeless tobacco: Not on file  . Alcohol Use: Yes    OB History    Grav Para Term Preterm Abortions TAB SAB Ect Mult Living                  Review of Systems  Constitutional: Positive for activity change. Negative for fever.  Gastrointestinal: Positive for constipation and rectal pain. Negative for nausea, vomiting, abdominal pain, diarrhea and anal bleeding.  Skin: Negative for rash and wound.    Allergies  Review of patient's allergies indicates no known allergies.  Home Medications   Current Outpatient Rx  Name  Route  Sig  Dispense  Refill  . HYDROCODONE-ACETAMINOPHEN 5-325 MG PO TABS   Oral   Take 1 tablet by mouth daily as needed. As needed for back pain.         Marland Kitchen HYDROCODONE-ACETAMINOPHEN 5-325 MG PO TABS      1-2 tablets po q 6 hours prn moderate to severe pain   20 tablet   0   . HYDROCORTISONE 2.5 % RE CREA   Rectal   Place rectally 2 (two) times daily. Apply rectally twice- a day for 7 days   30 g   0   . IBUPROFEN 600 MG PO TABS   Oral   Take 1 tablet (600 mg total) by mouth 3 (three) times daily with meals.   21 tablet   0   . POLYETHYLENE GLYCOL 3350 PO POWD   Oral   Take 17 g  by mouth 2 (two) times daily.   255 g   0   . PRENATAL 27-0.8 MG PO TABS   Oral   Take 1 tablet by mouth daily.           BP 112/73  Pulse 85  Temp 98.5 F (36.9 C) (Oral)  Resp 16  SpO2 100%  LMP 11/04/2012  Physical Exam  Nursing note and vitals reviewed. Constitutional: She appears well-developed and well-nourished.  Abdominal: She exhibits no distension and no mass. There is no tenderness. There is no rebound and no guarding.  Genitourinary:     Neurological: She is alert.  Skin: Skin is warm.    ED Course  [place] bedside Performed by: Ahliyah Nienow Authorized by: Sorrel Cassetta  Thrombectomy Performed by: Kimberley Speece Authorized by: Jimmie Molly Consent: Verbal consent obtained. Risks and benefits: risks, benefits and alternatives were discussed Consent given by: patient Patient understanding: patient states understanding of the procedure being performed Patient identity confirmed: verbally with patient Preparation: Patient was prepped and draped in the usual sterile fashion. Local anesthesia used: yes Anesthesia: local infiltration Local anesthetic: lidocaine 1% without epinephrine Anesthetic total: 5 ml Patient sedated: no Patient tolerance: Patient  tolerated the procedure well with no immediate complications. Comments: External hemorrhoids thrombosed excise removing a large blood clot. No posterior bleeding after procedure. Patient tolerated procedure well   (including critical care time)  Labs Reviewed - No data to display No results found.   1. Thrombosed external hemorrhoid       MDM  Thrombosed external hemorrhoid- patient tolerated procedure well we remove a large blood clot. Patient will be sent home with laxatives and topical steroids and analgesics.        Jimmie Molly, MD 11/17/12 1610  Jimmie Molly, MD 11/20/12 [phone]

## 2012-11-17 NOTE — ED Notes (Addendum)
Pt  Reports  Symptoms  Of        painfull  Swollen  hemmoriods  X  4  Days  According to   Pt     -  Pt  Reports   Some  Bleeding  Noted  -  Pt  Also  Reports   bm   Yesterday   And         She       States         Prep  h  Has  Not  releived  Symptoms

## 2014-04-13 ENCOUNTER — Emergency Department (INDEPENDENT_AMBULATORY_CARE_PROVIDER_SITE_OTHER)
Admission: EM | Admit: 2014-04-13 | Discharge: 2014-04-13 | Disposition: A | Discharge status: Home / Self Care | Payer: 59 | Source: Home / Self Care | Attending: Family Medicine | Admitting: Family Medicine

## 2014-04-13 ENCOUNTER — Other Ambulatory Visit (HOSPITAL_COMMUNITY)
Admission: RE | Admit: 2014-04-13 | Discharge: 2014-04-13 | Disposition: A | Discharge status: Home / Self Care | Payer: 59 | Source: Ambulatory Visit | Attending: Family Medicine | Admitting: Family Medicine

## 2014-04-13 ENCOUNTER — Encounter (HOSPITAL_COMMUNITY): Payer: Self-pay | Admitting: Emergency Medicine

## 2014-04-13 DIAGNOSIS — N760 Acute vaginitis: Secondary | ICD-10-CM

## 2014-04-13 DIAGNOSIS — Z113 Encounter for screening for infections with a predominantly sexual mode of transmission: Secondary | ICD-10-CM | POA: Insufficient documentation

## 2014-04-13 LAB — POCT URINALYSIS DIP (DEVICE)
Bilirubin Urine: NEGATIVE
Glucose, UA: NEGATIVE mg/dL
KETONES UR: NEGATIVE mg/dL
Leukocytes, UA: NEGATIVE
Nitrite: NEGATIVE
PROTEIN: NEGATIVE mg/dL
SPECIFIC GRAVITY, URINE: 1.015 (ref 1.005–1.030)
Urobilinogen, UA: 1 mg/dL (ref 0.0–1.0)
pH: 5.5 (ref 5.0–8.0)

## 2014-04-13 LAB — POCT PREGNANCY, URINE: PREG TEST UR: NEGATIVE

## 2014-04-13 MED ORDER — FLUCONAZOLE 150 MG PO TABS: ORAL_TABLET | ORAL | Status: DC

## 2014-04-13 MED ORDER — METRONIDAZOLE 500 MG PO TABS: 500.0000 mg | ORAL_TABLET | Freq: Two times a day (BID) | ORAL | Status: DC

## 2014-04-13 NOTE — ED Notes (Signed)
Call back number verified.  

## 2014-04-13 NOTE — ED Notes (Signed)
Pt c/o vag d/c onset 3 days Sx also include vag itching and pain during intercourse Also having mild abd pain, back pain and feeling nauseas Denies urinary sx, f/v/n/d Alert w/no signs of acute distress.

## 2014-04-13 NOTE — Discharge Instructions (Signed)
Vaginitis Vaginitis is an inflammation of the vagina. It is most often caused by a change in the normal balance of the bacteria and yeast that live in the vagina. This change in balance causes an overgrowth of certain bacteria or yeast, which causes the inflammation. There are different types of vaginitis, but the most common types are:  Bacterial vaginosis.  Yeast infection (candidiasis).  Trichomoniasis vaginitis. This is a sexually transmitted infection (STI).  Viral vaginitis.  Atropic vaginitis.  Allergic vaginitis. CAUSES  The cause depends on the type of vaginitis. Vaginitis can be caused by:  Bacteria (bacterial vaginosis).  Yeast (yeast infection).  A parasite (trichomoniasis vaginitis)  A virus (viral vaginitis).  Low hormone levels (atrophic vaginitis). Low hormone levels can occur during pregnancy, breastfeeding, or after menopause.  Irritants, such as bubble baths, scented tampons, and feminine sprays (allergic vaginitis). Other factors can change the normal balance of the yeast and bacteria that live in the vagina. These include:  Antibiotic medicines.  Poor hygiene.  Diaphragms, vaginal sponges, spermicides, birth control pills, and intrauterine devices (IUD).  Sexual intercourse.  Infection.  Uncontrolled diabetes.  A weakened immune system. SYMPTOMS  Symptoms can vary depending on the cause of the vaginitis. Common symptoms include:  Abnormal vaginal discharge.  The discharge is white, gray, or yellow with bacterial vaginosis.  The discharge is thick, white, and cheesy with a yeast infection.  The discharge is frothy and yellow or greenish with trichomoniasis.  A bad vaginal odor.  The odor is fishy with bacterial vaginosis.  Vaginal itching, pain, or swelling.  Painful intercourse.  Pain or burning when urinating. Sometimes, there are no symptoms. TREATMENT  Treatment will vary depending on the type of infection.   Bacterial  vaginosis and trichomoniasis are often treated with antibiotic creams or pills.  Yeast infections are often treated with antifungal medicines, such as vaginal creams or suppositories.  Viral vaginitis has no cure, but symptoms can be treated with medicines that relieve discomfort. Your sexual partner should be treated as well.  Atrophic vaginitis may be treated with an estrogen cream, pill, suppository, or vaginal ring. If vaginal dryness occurs, lubricants and moisturizing creams may help. You may be told to avoid scented soaps, sprays, or douches.  Allergic vaginitis treatment involves quitting the use of the product that is causing the problem. Vaginal creams can be used to treat the symptoms. HOME CARE INSTRUCTIONS   Take all medicines as directed by your caregiver.  Keep your genital area clean and dry. Avoid soap and only rinse the area with water.  Avoid douching. It can remove the healthy bacteria in the vagina.  Do not use tampons or have sexual intercourse until your vaginitis has been treated. Use sanitary pads while you have vaginitis.  Wipe from front to back. This avoids the spread of bacteria from the rectum to the vagina.  Let air reach your genital area.  Wear cotton underwear to decrease moisture buildup.  Avoid wearing underwear while you sleep until your vaginitis is gone.  Avoid tight pants and underwear or nylons without a cotton panel.  Take off wet clothing (especially bathing suits) as soon as possible.  Use mild, non-scented products. Avoid using irritants, such as:  Scented feminine sprays.  Fabric softeners.  Scented detergents.  Scented tampons.  Scented soaps or bubble baths.  Practice safe sex and use condoms. Condoms may prevent the spread of trichomoniasis and viral vaginitis. SEEK MEDICAL CARE IF:   You have abdominal pain.  You  have a fever or persistent symptoms for more than 2 3 days.  You have a fever and your symptoms suddenly  get worse. Document Released: 09/08/2007 Document Revised: 08/05/2012 Document Reviewed: 04/23/2012 [facility] Institute[facility] Patient Information 2014 [place]ExitCare, [place].  Bacterial Vaginosis Bacterial vaginosis is a vaginal infection that occurs when the normal balance of bacteria in the vagina is disrupted. It results from an overgrowth of certain bacteria. This is the most common vaginal infection in women of childbearing age. Treatment is important to prevent complications, especially in pregnant women, as it can cause a premature delivery. CAUSES  Bacterial vaginosis is caused by an increase in harmful bacteria that are normally present in smaller amounts in the vagina. Several different kinds of bacteria can cause bacterial vaginosis. However, the reason that the condition develops is not fully understood. RISK FACTORS Certain activities or behaviors can put you at an increased risk of developing bacterial vaginosis, including:  Having a new sex partner or multiple sex partners.  Douching.  Using an intrauterine device (IUD) for contraception. Women do not get bacterial vaginosis from toilet seats, bedding, swimming pools, or contact with objects around them. SIGNS AND SYMPTOMS  Some women with bacterial vaginosis have no signs or symptoms. Common symptoms include:  Grey vaginal discharge.  A fishlike odor with discharge, especially after sexual intercourse.  Itching or burning of the vagina and vulva.  Burning or pain with urination. DIAGNOSIS  Your health care provider will take a medical history and examine the vagina for signs of bacterial vaginosis. A sample of vaginal fluid may be taken. Your health care provider will look at this sample under a microscope to check for bacteria and abnormal cells. A vaginal pH test may also be done.  TREATMENT  Bacterial vaginosis may be treated with antibiotic medicines. These may be given in the form of a pill or a vaginal cream. A second round of antibiotics  may be prescribed if the condition comes back after treatment.  HOME CARE INSTRUCTIONS   Only take over-the-counter or prescription medicines as directed by your health care provider.  If antibiotic medicine was prescribed, take it as directed. Make sure you finish it even if you start to feel better.  Do not have sex until treatment is completed.  Tell all sexual partners that you have a vaginal infection. They should see their health care provider and be treated if they have problems, such as a mild rash or itching.  Practice safe sex by using condoms and only having one sex partner. SEEK MEDICAL CARE IF:   Your symptoms are not improving after 3 days of treatment.  You have increased discharge or pain.  You have a fever. MAKE SURE YOU:   Understand these instructions.  Will watch your condition.  Will get help right away if you are not doing well or get worse. FOR MORE INFORMATION  Centers for Disease Control and Prevention, Division of STD Prevention: SolutionApps.co.zawww.cdc.gov/std American Sexual Health Association (ASHA): www.ashastd.org  Document Released: 11/11/2005 Document Revised: 09/01/2013 Document Reviewed: 06/23/2013 [facility] LtdExitCare Patient Information 2014 [place]ExitCare, [place]LLC.

## 2014-04-13 NOTE — ED Provider Notes (Signed)
CSN: [id number]     Arrival date & time 04/13/14  1724 History   First MD Initiated Contact with Patient 04/13/14 1841     Chief Complaint  Patient presents with  . Vaginal Discharge   (Consider location/radiation/quality/duration/timing/severity/associated sxs/prior Treatment) HPI Comments: LNMP: 04-06-2014 Married  Patient is a 38 y.o. female presenting with vaginal discharge. The history is provided by the patient.  Vaginal Discharge Quality:  Thick and white Severity:  Moderate Onset quality:  Gradual Duration:  3 days Timing:  Constant Progression:  Unchanged Chronicity:  New Associated symptoms: dyspareunia, nausea and vaginal itching   Associated symptoms: no abdominal pain, no dysuria, no fever, no genital lesions, no rash, no urinary frequency, no urinary hesitancy, no urinary incontinence and no vomiting   Risk factors: no immunosuppression, no new sexual partner, no STI and no STI exposure     Past Medical History  Diagnosis Date  . Back pain    History reviewed. No pertinent past surgical history. No family history on file. History  Substance Use Topics  . Smoking status: Current Every Day Smoker    Types: Cigarettes  . Smokeless tobacco: Not on file  . Alcohol Use: Yes   OB History   Grav Para Term Preterm Abortions TAB SAB Ect Mult Living                 Review of Systems  Constitutional: Negative for fever and chills.  Eyes: Negative.   Respiratory: Negative.   Cardiovascular: Negative.   Gastrointestinal: Positive for nausea. Negative for vomiting, abdominal pain, diarrhea, constipation and blood in stool.  Genitourinary: Positive for vaginal discharge and dyspareunia. Negative for bladder incontinence, dysuria, hesitancy, urgency, frequency, hematuria, flank pain, decreased urine volume, vaginal bleeding, difficulty urinating, genital sores, vaginal pain, menstrual problem and pelvic pain.  Musculoskeletal: Negative.   Skin: Negative.      Allergies  Review of patient's allergies indicates no known allergies.  Home Medications   Prior to Admission medications   Medication Sig Start Date End Date Taking? Authorizing Provider  HYDROcodone-acetaminophen (NORCO/VICODIN) 5-325 MG per tablet Take 1 tablet by mouth daily as needed. As needed for back pain.    Historical Provider, MD  HYDROcodone-acetaminophen (NORCO/VICODIN) 5-325 MG per tablet 1-2 tablets po q 6 hours prn moderate to severe pain 10/29/12   Gavin PoundMichael Y. Ghim, MD  hydrocortisone (ANUSOL-HC) 2.5 % rectal cream Place rectally 2 (two) times daily. Apply rectally twice- a day for 7 days 11/17/12   Jimmie MollyPaolo Coll, MD  ibuprofen (ADVIL,MOTRIN) 600 MG tablet Take 1 tablet (600 mg total) by mouth 3 (three) times daily with meals. 10/29/12   Gavin PoundMichael Y. Ghim, MD  Prenatal Vit-Fe Fumarate-FA (MULTIVITAMIN-PRENATAL) 27-0.8 MG TABS Take 1 tablet by mouth daily.    Historical Provider, MD   BP 92/54  Pulse 78  Temp(Src) 98.2 F (36.8 C) (Oral)  Resp 14  SpO2 98%  LMP 03/30/2014 Physical Exam  Nursing note and vitals reviewed. Constitutional: She is oriented to person, place, and time. She appears well-developed and well-nourished. No distress.  HENT:  Head: Normocephalic and atraumatic.  Eyes: Conjunctivae are normal. No scleral icterus.  Cardiovascular: Normal rate, regular rhythm and normal heart sounds.   Pulmonary/Chest: Effort normal and breath sounds normal. No respiratory distress. She has no wheezes.  Abdominal: Soft. Bowel sounds are normal. She exhibits no distension.  Genitourinary: Uterus normal. Pelvic exam was performed with patient supine. There is no rash, tenderness or lesion on the right labia. There is  no rash, tenderness or lesion on the left labia. Cervix exhibits motion tenderness. Cervix exhibits no discharge and no friability. Right adnexum displays no mass, no tenderness and no fullness. Left adnexum displays no mass, no tenderness and no fullness. No  erythema, tenderness or bleeding around the vagina. No foreign body around the vagina. Vaginal discharge found.  Moderate adherent white discharge  Musculoskeletal: Normal range of motion.  Neurological: She is alert and oriented to person, place, and time.  Skin: Skin is warm and dry. No rash noted.  Psychiatric: She has a normal mood and affect. Her behavior is normal.    ED Course  Procedures (including critical care time) Labs Review Labs Reviewed  POCT URINALYSIS DIP (DEVICE) - Abnormal; Notable for the following:    Hgb urine dipstick TRACE (*)    All other components within normal limits  POCT PREGNANCY, URINE  CERVICOVAGINAL ANCILLARY ONLY    Imaging Review No results found.   MDM   1. Vaginitis    Will treat for vaginal yeast and BV with any additional treatment to be based upon lab results. UPT negative.  UA grossly normal.   Jess BartersJennifer Lee RidgevillePresson, GeorgiaPA 04/13/14 [phone]

## 2014-04-14 NOTE — ED Provider Notes (Signed)
Medical screening examination/treatment/procedure(s) were performed by a resident physician or non-physician practitioner and as the supervising physician I was immediately available for consultation/collaboration.  Danton Palmateer, MD    Allen Basista S Emmanuel Gruenhagen, MD 04/14/14 0753 

## 2014-04-14 NOTE — ED Notes (Signed)
GC/Chlamydia neg., Affirm: Candida and Trich neg., Gardnerella pos.  Pt. adequately treated with Flagyl. Desiree LucySuzanne M [facility] 04/14/2014

## 2014-06-25 ENCOUNTER — Emergency Department (HOSPITAL_COMMUNITY): Payer: 59

## 2014-06-25 ENCOUNTER — Encounter (HOSPITAL_COMMUNITY): Payer: Self-pay | Admitting: Emergency Medicine

## 2014-06-25 ENCOUNTER — Emergency Department (HOSPITAL_COMMUNITY)
Admission: EM | Admit: 2014-06-25 | Discharge: 2014-06-25 | Disposition: A | Discharge status: Home / Self Care | Payer: 59 | Attending: Emergency Medicine | Admitting: Emergency Medicine

## 2014-06-25 DIAGNOSIS — F17200 Nicotine dependence, unspecified, uncomplicated: Secondary | ICD-10-CM | POA: Diagnosis not present

## 2014-06-25 DIAGNOSIS — X58XXXA Exposure to other specified factors, initial encounter: Secondary | ICD-10-CM | POA: Diagnosis not present

## 2014-06-25 DIAGNOSIS — Z792 Long term (current) use of antibiotics: Secondary | ICD-10-CM | POA: Diagnosis not present

## 2014-06-25 DIAGNOSIS — Z3202 Encounter for pregnancy test, result negative: Secondary | ICD-10-CM | POA: Diagnosis not present

## 2014-06-25 DIAGNOSIS — Z79899 Other long term (current) drug therapy: Secondary | ICD-10-CM | POA: Diagnosis not present

## 2014-06-25 DIAGNOSIS — S8253XA Displaced fracture of medial malleolus of unspecified tibia, initial encounter for closed fracture: Secondary | ICD-10-CM | POA: Insufficient documentation

## 2014-06-25 DIAGNOSIS — Y9389 Activity, other specified: Secondary | ICD-10-CM | POA: Diagnosis not present

## 2014-06-25 DIAGNOSIS — S82892A Other fracture of left lower leg, initial encounter for closed fracture: Secondary | ICD-10-CM

## 2014-06-25 DIAGNOSIS — H5789 Other specified disorders of eye and adnexa: Secondary | ICD-10-CM | POA: Insufficient documentation

## 2014-06-25 DIAGNOSIS — S99919A Unspecified injury of unspecified ankle, initial encounter: Secondary | ICD-10-CM

## 2014-06-25 DIAGNOSIS — Y92009 Unspecified place in unspecified non-institutional (private) residence as the place of occurrence of the external cause: Secondary | ICD-10-CM | POA: Diagnosis not present

## 2014-06-25 DIAGNOSIS — S92109A Unspecified fracture of unspecified talus, initial encounter for closed fracture: Secondary | ICD-10-CM | POA: Diagnosis not present

## 2014-06-25 DIAGNOSIS — IMO0002 Reserved for concepts with insufficient information to code with codable children: Secondary | ICD-10-CM | POA: Diagnosis not present

## 2014-06-25 DIAGNOSIS — S8990XA Unspecified injury of unspecified lower leg, initial encounter: Secondary | ICD-10-CM | POA: Insufficient documentation

## 2014-06-25 DIAGNOSIS — S99929A Unspecified injury of unspecified foot, initial encounter: Secondary | ICD-10-CM

## 2014-06-25 MED ORDER — OXYCODONE-ACETAMINOPHEN 5-325 MG PO TABS
1.0000 | ORAL_TABLET | Freq: Once | ORAL | Status: AC
  Administered 2014-06-25: 1 via ORAL
  Filled 2014-06-25: qty 1

## 2014-06-25 MED ORDER — OXYCODONE-ACETAMINOPHEN 5-325 MG PO TABS: 1.0000 | ORAL_TABLET | Freq: Four times a day (QID) | ORAL | Status: AC | PRN

## 2014-06-25 MED ORDER — HYDROCODONE-ACETAMINOPHEN 5-325 MG PO TABS
2.0000 | ORAL_TABLET | Freq: Once | ORAL | Status: AC
  Administered 2014-06-25: 2 via ORAL
  Filled 2014-06-25: qty 2

## 2014-06-25 NOTE — ED Notes (Signed)
Ortho Tech paged for splinting

## 2014-06-25 NOTE — ED Notes (Signed)
Ortho at bedside.

## 2014-06-25 NOTE — ED Notes (Signed)
Patient being transported to Xray

## 2014-06-25 NOTE — ED Notes (Signed)
Injured left ankle going down a slide with socks on. Pt. Took a vicodin with no relief. Cms intact.

## 2014-06-25 NOTE — Discharge Instructions (Signed)
Ankle Fracture °A fracture is a break in a bone. A cast or splint may be used to protect the ankle and heal the break. Sometimes, surgery is needed. °HOME CARE °· Use crutches as told by your doctor. It is very important that you use your crutches correctly. °· Do not put weight or pressure on the injured ankle until told by your doctor. °· Keep your ankle raised (elevated) when sitting or lying down. °· Apply ice to the ankle: °¨ Put ice in a plastic bag. °¨ Place a towel between your cast and the bag. °¨ Leave the ice on for 20 minutes, 2-3 times a day. °· If you have a plaster or fiberglass cast: °¨ Do not try to scratch under the cast with any objects. °¨ Check the skin around the cast every day. You may put lotion on red or sore areas. °¨ Keep your cast dry and clean. °· If you have a plaster splint: °¨ Wear the splint as told by your doctor. °¨ You can loosen the elastic around the splint if your toes get numb, tingle, or turn cold or blue. °· Do not put pressure on any part of your cast or splint. It may break. Rest your plaster splint or cast only on a pillow the first 24 hours until it is fully hardened. °· Cover your cast or splint with a plastic bag during showers. °· Do not lower your cast or splint into water. °· Take medicine as told by your doctor. °· Do not drive until your doctor says it is safe. °· Follow-up with your doctor as told. It is very important that you go to your follow-up visits. °GET HELP IF: °The swelling and discomfort gets worse.  °GET HELP RIGHT AWAY IF:  °· Your splint or cast breaks. °· You continue to have very bad pain. °· You have new pain or swelling after your splint or cast was put on. °· Your skin or toes below the injured ankle: °¨ Turn blue or gray. °¨ Feel cold, numb, or you cannot feel them. °· There is a bad smell or yellowish white fluid (pus) coming from under the splint or cast. °MAKE SURE YOU:  °· Understand these instructions. °· Will watch your  condition. °· Will get help right away if you are not doing well or get worse. °Document Released: 09/08/2009 Document Revised: 09/01/2013 Document Reviewed: 06/10/2013 °ExitCare® Patient Information ©2015 Exit[employer], LLC. This information is not intended to replace advice given to you by your health care provider. Make sure you discuss any questions you have with your health care provider. ° °

## 2014-06-25 NOTE — ED Notes (Signed)
MD at bedside. 

## 2014-06-25 NOTE — Progress Notes (Signed)
Orthopedic Tech Progress Note Patient Details:  Johny Shockiya R Pecore 08/01/1976 [id number] PSL splint applied to LLE with stirrup. Crutches fit for height and comfort. Ortho Devices Type of Ortho Device: Crutches;Stirrup splint;Post (short leg) splint Ortho Device/Splint Location: LLE Ortho Device/Splint Interventions: Application   Asia R Thompson 06/25/2014, 8:29 AM

## 2014-06-25 NOTE — ED Provider Notes (Signed)
CSN: [id number]     Arrival date & time 06/25/14  4098 History   First MD Initiated Contact with Patient 06/25/14 0701     Chief Complaint  Patient presents with  . Ankle Pain     (Consider location/radiation/quality/duration/timing/severity/associated sxs/prior Treatment) HPI Complains of left ankle pain nonradiating onset 11:30 PM yesterday when she slid down a poor slide in her home causing her forcibly invert her left ankle. Pain is moderate to severe worse with movement improved with remaining still she treated it with ice and one Vicodin tablet at 11:30 PM no other associated symptoms. No other injury Past Medical History  Diagnosis Date  . Back pain    History reviewed. No pertinent past surgical history. shoulder surgery History reviewed. No pertinent family history. History  Substance Use Topics  . Smoking status: Current Every Day Smoker    Types: Cigarettes  . Smokeless tobacco: Not on file  . Alcohol Use: Yes   positive marijuana use OB History   Grav Para Term Preterm Abortions TAB SAB Ect Mult Living                 Review of Systems  Musculoskeletal: Positive for arthralgias.       Ankle pain  All other systems reviewed and are negative.     Allergies  Review of patient's allergies indicates no known allergies.  Home Medications  Correction medications: Vicodin otherwise negative. Patient only one Vicodin Prior to Admission medications   Medication Sig Start Date End Date Taking? Authorizing Provider  fluconazole (DIFLUCAN) 150 MG tablet Take one pill by mouth x 1 dose 04/13/14   Ardis Rowan, PA  HYDROcodone-acetaminophen (NORCO/VICODIN) 5-325 MG per tablet Take 1 tablet by mouth daily as needed. As needed for back pain.    Historical Provider, MD  HYDROcodone-acetaminophen (NORCO/VICODIN) 5-325 MG per tablet 1-2 tablets po q 6 hours prn moderate to severe pain 10/29/12   Gavin Pound. Ghim, MD  hydrocortisone (ANUSOL-HC) 2.5 % rectal cream Place  rectally 2 (two) times daily. Apply rectally twice- a day for 7 days 11/17/12   Jimmie Molly, MD  ibuprofen (ADVIL,MOTRIN) 600 MG tablet Take 1 tablet (600 mg total) by mouth 3 (three) times daily with meals. 10/29/12   Gavin Pound. Ghim, MD  metroNIDAZOLE (FLAGYL) 500 MG tablet Take 1 tablet (500 mg total) by mouth 2 (two) times daily. X 7 days 04/13/14   Ardis Rowan, PA  Prenatal Vit-Fe Fumarate-FA (MULTIVITAMIN-PRENATAL) 27-0.8 MG TABS Take 1 tablet by mouth daily.    Historical Provider, MD   BP 122/72  Pulse 98  Temp(Src) 98.2 F (36.8 C) (Oral)  Resp 18  Ht 4\' 9"  (1.448 m)  Wt 165 lb (74.844 kg)  BMI 35.70 kg/m2  SpO2 98%  LMP 06/25/2014 Physical Exam  Nursing note and vitals reviewed. Constitutional: She appears well-developed and well-nourished. No distress.  HENT:  Head: Normocephalic and atraumatic.  Right Ear: External ear normal.  Left Ear: External ear normal.  Nose: Nose normal.  Eyes: EOM are normal. Right eye exhibits discharge. Left eye exhibits no discharge.  Neck: Neck supple. No tracheal deviation present.  Cardiovascular: Normal rate.   Pulmonary/Chest: Effort normal. No respiratory distress.  Abdominal: There is tenderness.  Musculoskeletal:  Left lower Extremity : Skin intact swollen tender diffusely across ankle. Nontender her proximal fibula. DP pulse 2+. Negative Thompson sign. Foot nontender and otherwise atraumatic all other extremities without contusion abrasion or deformity neurovascularly intact    ED Course  Procedures (including critical care time) Labs Review Labs Reviewed - No data to display  Imaging Review No results found. X-rays viewed by me and discussed with Dr. Ophelia CharterYates. A stirrup splint was placed on patient by the orthopedic technician. Splint is comfortable for patient 845amher pain is much improved after splint and treatment with opioids   EKG Interpretation None      Results for orders placed during the hospital encounter of  04/13/14  POCT URINALYSIS DIP (DEVICE)      Result Value Ref Range   Glucose, UA NEGATIVE  NEGATIVE mg/dL   Bilirubin Urine NEGATIVE  NEGATIVE   Ketones, ur NEGATIVE  NEGATIVE mg/dL   Specific Gravity, Urine 1.015  1.005 - 1.030   Hgb urine dipstick TRACE (*) NEGATIVE   pH 5.5  5.0 - 8.0   Protein, ur NEGATIVE  NEGATIVE mg/dL   Urobilinogen, UA 1.0  0.0 - 1.0 mg/dL   Nitrite NEGATIVE  NEGATIVE   Leukocytes, UA NEGATIVE  NEGATIVE  POCT PREGNANCY, URINE      Result Value Ref Range   Preg Test, Ur NEGATIVE  NEGATIVE   Dg Ankle Complete Left  06/25/2014   CLINICAL DATA:  Fall, painful swollen ankle  EXAM: LEFT ANKLE COMPLETE - 3+ VIEW  COMPARISON:  None.  FINDINGS: There is an acute, minimally displaced obliquely oriented fracture through the distal fibula at the level of the tibial plafond. There is widening of the medial tibiotalar clear space to 5 mm with slight lateral translation of the talus with respect to the distal tibia. Additionally, there is a small linear bone fragment adjacent to the medial talus. Findings are most consistent with avulsion fracture from the insertion of the deltoid ligaments and resultant medial joint instability. On the lateral view there are bony debris posterior to the distal tibia. These appear well corticated. A small posterior tibial fracture is not entirely excluded.  IMPRESSION: 1. Complex ankle fracture with medial instability and subluxation as detailed below. 2. Obliquely oriented fracture through the distal fibula at the level of the tibial plafond. 3. Avulsion fracture from the medial aspect of the talus at the insertion of the deltoid ligaments 4. Widening of the medial tibiotalar clear space with lateral subluxation of the talus. 5. Bony fragments posterior to the distal tibia appear well corticated. However, given the other acute injuries a small posterior tibial fracture is not entirely excluded. Alternately, this may represent sequela of a prior injury.    Electronically Signed   By: Malachy MoanHeath  McCullough M.D.   On: 06/25/2014 07:38    MDM  Plan patient given crutches. Prescription Percocet She is to callDr. Marlene BastYates's office on August 3 to arrange for office visit August 4. Final diagnoses:  None   Diagnosis closed fracture of left ankle    Doug SouSam Eleanor Gatliff, MD 06/25/14 [phone]

## 2014-06-28 ENCOUNTER — Encounter (HOSPITAL_COMMUNITY): Payer: Self-pay | Admitting: *Deleted

## 2014-06-28 ENCOUNTER — Other Ambulatory Visit (HOSPITAL_COMMUNITY): Payer: Self-pay | Admitting: Orthopaedic Surgery

## 2014-06-28 NOTE — H&P (Signed)
[facility]   A Division of [facility]   [address], [place], [place] [address] Telephone: [phone]  Fax: [phone]     PATIENT: Regina Simmons, Regina Simmons   MR#: [id number]  DOB: [birth year]       CHIEF COMPLAINT:  Left ankle injury.   HISTORY OF PRESENT ILLNESS:  Patient is a 38 year old female who injured her left ankle while playing with her daughter at home on 06/25/2014.  She was [facility] where she underwent radiographs and was found to have a fracture of the left ankle.  She was placed in a splint and instructed in nonweightbearing with crutches.  She presents to the office today for further evaluation and treatment.     CURRENT MEDICATIONS:  Percocet as needed for pain.   ALLERGIES:  No known drug allergies.     PAST MEDICAL HISTORY:  Patient has been healthy without chronic illnesses.  She reports she had migraine headaches during her pregnancy, but none afterwards.  She has symptoms occasionally of acid reflux.     PAST SURGICAL HISTORY:  Includes rotator cuff repair by Dr. Cleophas DunkerWhitfield in 2008, C-section x2.     SOCIAL HISTORY:  She is married to Nationwide Mutual InsuranceJames Kaps.  She has no intake of tobacco and has been a nonsmoker for 3 years . She has occasional intake of alcohol.     FAMILY HISTORY:  Positive for diabetes, heart disease, and hypertension.     REVIEW OF SYSTEMS:  All other review of systems negative as pertaining to history of present illness.     Her primary care physician is Dr. Susann GivensLalonde.     PHYSICAL EXAMINATION:  Patient is 5 feet 11 inches, weight 175 pounds.  Alert and oriented x3.  In no acute distress.  HEENT:  Normocephalic, atraumatic.  Pupils equal, round, and reactive to light and accommodation.  Extraocular movements intact.  Lungs clear to auscultation.  Heart:  Regular rate and rhythm.  No murmur heard.  Abdomen:  Soft, nontender.  Bowel sounds present.  Genital, rectal, and breast exams not  performed.  No pertinent to present illness.  Extremities:  She is in a posterior splint, well-padded to the left lower extremity.  Capillary refill of the toes is intact and she has good motion and sensation of her toes.     RADIOGRAPHS:  [facility].  Patient does have widening and displacement of the mortis medially on the AP view.  She has a distal fibular fracture, which is mildly displaced as well.  She has an avulsion fracture noted also the medial aspect of the talus at the insertion of the deltoid ligaments.  Radiographs reviewed include 3 views of the left ankle.  Report as stated above.     ASSESSMENT:  Displaced fracture of the left ankle with distal fibula fracture and widening of the mortis medially.     PLAN:  After discussion and review with Dr. Ophelia CharterYates, it is felt patient will require surgical intervention for open reduction and internal fixation of the left ankle fracture.  This will be scheduled for tomorrow afternoon.  She will remain in her splint and remain nonweightbearing.  She has Percocet to use for discomfort.  She is instructed in ice and elevation.  Patient will undergo open reduction and internal fixation of the left ankle fracture by Dr. Ophelia CharterYates on 06/29/2014.  She will report for surgery and have labs done on the same day.  All  questions encouraged and answered.  She will follow up 2 weeks postop.            Maud Deed, PA-C Mark C. Ophelia Charter, M.D.    Auto-Authenticated by Maud Deed, PA-C

## 2014-06-29 ENCOUNTER — Encounter (HOSPITAL_COMMUNITY): Payer: 59 | Admitting: Certified Registered Nurse Anesthetist

## 2014-06-29 ENCOUNTER — Observation Stay (HOSPITAL_COMMUNITY)
Admission: RE | Admit: 2014-06-29 | Discharge: 2014-06-30 | Disposition: A | Discharge status: Home / Self Care | Payer: 59 | Source: Ambulatory Visit | Attending: Orthopaedic Surgery | Admitting: Orthopaedic Surgery

## 2014-06-29 ENCOUNTER — Ambulatory Visit (HOSPITAL_COMMUNITY): Payer: 59

## 2014-06-29 ENCOUNTER — Encounter (HOSPITAL_COMMUNITY): Payer: Self-pay | Admitting: *Deleted

## 2014-06-29 ENCOUNTER — Encounter (HOSPITAL_COMMUNITY)
Admission: RE | Disposition: A | Discharge status: Home / Self Care | Payer: Self-pay | Source: Ambulatory Visit | Attending: Orthopaedic Surgery

## 2014-06-29 ENCOUNTER — Ambulatory Visit (HOSPITAL_COMMUNITY): Payer: 59 | Admitting: Certified Registered Nurse Anesthetist

## 2014-06-29 DIAGNOSIS — X58XXXA Exposure to other specified factors, initial encounter: Secondary | ICD-10-CM | POA: Diagnosis not present

## 2014-06-29 DIAGNOSIS — K219 Gastro-esophageal reflux disease without esophagitis: Secondary | ICD-10-CM | POA: Insufficient documentation

## 2014-06-29 DIAGNOSIS — S8263XA Displaced fracture of lateral malleolus of unspecified fibula, initial encounter for closed fracture: Principal | ICD-10-CM | POA: Insufficient documentation

## 2014-06-29 DIAGNOSIS — Y92009 Unspecified place in unspecified non-institutional (private) residence as the place of occurrence of the external cause: Secondary | ICD-10-CM | POA: Diagnosis not present

## 2014-06-29 DIAGNOSIS — F17200 Nicotine dependence, unspecified, uncomplicated: Secondary | ICD-10-CM | POA: Insufficient documentation

## 2014-06-29 DIAGNOSIS — S93429A Sprain of deltoid ligament of unspecified ankle, initial encounter: Secondary | ICD-10-CM | POA: Insufficient documentation

## 2014-06-29 DIAGNOSIS — S82892A Other fracture of left lower leg, initial encounter for closed fracture: Secondary | ICD-10-CM | POA: Diagnosis present

## 2014-06-29 DIAGNOSIS — S82899A Other fracture of unspecified lower leg, initial encounter for closed fracture: Secondary | ICD-10-CM

## 2014-06-29 HISTORY — PX: ORIF ANKLE FRACTURE: SHX5408

## 2014-06-29 HISTORY — DX: Other fracture of left lower leg, initial encounter for closed fracture: S82.892A

## 2014-06-29 HISTORY — DX: Gastro-esophageal reflux disease without esophagitis: K21.9

## 2014-06-29 LAB — COMPREHENSIVE METABOLIC PANEL
ALT: 16 U/L (ref 0–35)
AST: 23 U/L (ref 0–37)
Albumin: 3.7 g/dL (ref 3.5–5.2)
Alkaline Phosphatase: 75 U/L (ref 39–117)
Anion gap: 11 (ref 5–15)
BUN: 9 mg/dL (ref 6–23)
CALCIUM: 8.7 mg/dL (ref 8.4–10.5)
CO2: 26 mEq/L (ref 19–32)
Chloride: 101 mEq/L (ref 96–112)
Creatinine, Ser: 0.77 mg/dL (ref 0.50–1.10)
GFR calc non Af Amer: >90 mL/min (ref >90)
Glucose, Bld: 84 mg/dL (ref 70–99)
Potassium: 3.8 mEq/L (ref 3.7–5.3)
Sodium: 138 mEq/L (ref 137–147)
Total Bilirubin: 0.4 mg/dL (ref 0.3–1.2)
Total Protein: 7.2 g/dL (ref 6.0–8.3)

## 2014-06-29 LAB — HCG, SERUM, QUALITATIVE: Preg, Serum: NEGATIVE

## 2014-06-29 LAB — CBC
HEMATOCRIT: 41.5 % (ref 36.0–46.0)
Hemoglobin: 13.8 g/dL (ref 12.0–15.0)
MCH: 30.7 pg (ref 26.0–34.0)
MCHC: 33.3 g/dL (ref 30.0–36.0)
MCV: 92.4 fL (ref 78.0–100.0)
PLATELETS: 246 10*3/uL (ref 150–400)
RBC: 4.49 MIL/uL (ref 3.87–5.11)
RDW: 11.9 % (ref 11.5–15.5)
WBC: 6.9 10*3/uL (ref 4.0–10.5)

## 2014-06-29 SURGERY — OPEN REDUCTION INTERNAL FIXATION (ORIF) ANKLE FRACTURE
Anesthesia: General | Site: Ankle | Laterality: Left

## 2014-06-29 MED ORDER — LACTATED RINGERS IV SOLN
INTRAVENOUS | Status: DC
  Administered 2014-06-29: 16:00:00 via INTRAVENOUS

## 2014-06-29 MED ORDER — PROPOFOL 10 MG/ML IV BOLUS
INTRAVENOUS | Status: DC | PRN
  Administered 2014-06-29: 200 mg via INTRAVENOUS

## 2014-06-29 MED ORDER — WHITE PETROLATUM GEL
Status: AC
  Filled 2014-06-29: qty 5

## 2014-06-29 MED ORDER — LACTATED RINGERS IV SOLN
INTRAVENOUS | Status: DC | PRN
  Administered 2014-06-29 (×2): via INTRAVENOUS

## 2014-06-29 MED ORDER — METHOCARBAMOL 500 MG PO TABS
500.0000 mg | ORAL_TABLET | Freq: Four times a day (QID) | ORAL | Status: DC | PRN
  Administered 2014-06-29 – 2014-06-30 (×2): 500 mg via ORAL
  Filled 2014-06-29 (×2): qty 1

## 2014-06-29 MED ORDER — ASPIRIN EC 325 MG PO TBEC: 325.0000 mg | DELAYED_RELEASE_TABLET | Freq: Every day | ORAL | Status: AC

## 2014-06-29 MED ORDER — HYDROCODONE-ACETAMINOPHEN 5-325 MG PO TABS: 1.0000 | ORAL_TABLET | ORAL | Status: DC | PRN

## 2014-06-29 MED ORDER — SENNOSIDES-DOCUSATE SODIUM 8.6-50 MG PO TABS: 1.0000 | ORAL_TABLET | Freq: Every evening | ORAL | Status: DC | PRN

## 2014-06-29 MED ORDER — OXYCODONE-ACETAMINOPHEN 5-325 MG PO TABS
1.0000 | ORAL_TABLET | ORAL | Status: DC | PRN
  Administered 2014-06-29: 2 via ORAL
  Administered 2014-06-30 (×2): 1 via ORAL
  Administered 2014-06-30 (×2): 2 via ORAL
  Filled 2014-06-29 (×3): qty 2

## 2014-06-29 MED ORDER — PHENYLEPHRINE 40 MCG/ML (10ML) SYRINGE FOR IV PUSH (FOR BLOOD PRESSURE SUPPORT)
PREFILLED_SYRINGE | INTRAVENOUS | Status: AC
  Filled 2014-06-29: qty 10

## 2014-06-29 MED ORDER — HYDROMORPHONE HCL PF 1 MG/ML IJ SOLN
INTRAMUSCULAR | Status: AC
  Filled 2014-06-29: qty 1

## 2014-06-29 MED ORDER — BISACODYL 10 MG RE SUPP: 10.0000 mg | Freq: Every day | RECTAL | Status: DC | PRN

## 2014-06-29 MED ORDER — 0.9 % SODIUM CHLORIDE (POUR BTL) OPTIME
TOPICAL | Status: DC | PRN
Start: 2014-06-29 — End: 2014-06-29
  Administered 2014-06-29: 1000 mL

## 2014-06-29 MED ORDER — ROCURONIUM BROMIDE 50 MG/5ML IV SOLN
INTRAVENOUS | Status: AC
  Filled 2014-06-29: qty 1

## 2014-06-29 MED ORDER — HYDROMORPHONE HCL PF 1 MG/ML IJ SOLN
0.5000 mg | INTRAMUSCULAR | Status: DC | PRN
  Administered 2014-06-29 (×2): 0.5 mg via INTRAVENOUS

## 2014-06-29 MED ORDER — METHOCARBAMOL 1000 MG/10ML IJ SOLN
500.0000 mg | Freq: Four times a day (QID) | INTRAVENOUS | Status: DC | PRN
  Filled 2014-06-29: qty 5

## 2014-06-29 MED ORDER — FLEET ENEMA 7-19 GM/118ML RE ENEM
1.0000 | ENEMA | Freq: Once | RECTAL | Status: AC | PRN
  Filled 2014-06-29: qty 1

## 2014-06-29 MED ORDER — ONDANSETRON HCL 4 MG/2ML IJ SOLN
INTRAMUSCULAR | Status: AC
  Filled 2014-06-29: qty 2

## 2014-06-29 MED ORDER — DOCUSATE SODIUM 100 MG PO CAPS
100.0000 mg | ORAL_CAPSULE | Freq: Two times a day (BID) | ORAL | Status: DC
  Administered 2014-06-29 – 2014-06-30 (×2): 100 mg via ORAL
  Filled 2014-06-29 (×2): qty 1

## 2014-06-29 MED ORDER — ONDANSETRON HCL 4 MG/2ML IJ SOLN
INTRAMUSCULAR | Status: DC | PRN
  Administered 2014-06-29: 4 mg via INTRAVENOUS

## 2014-06-29 MED ORDER — OXYCODONE-ACETAMINOPHEN 5-325 MG PO TABS
ORAL_TABLET | ORAL | Status: AC
  Filled 2014-06-29: qty 2

## 2014-06-29 MED ORDER — HYDROMORPHONE HCL PF 1 MG/ML IJ SOLN
0.2500 mg | INTRAMUSCULAR | Status: DC | PRN
Start: 2014-06-29 — End: 2014-06-29
  Administered 2014-06-29: 1 mg via INTRAVENOUS
  Administered 2014-06-29 (×2): 0.5 mg via INTRAVENOUS

## 2014-06-29 MED ORDER — METOCLOPRAMIDE HCL 5 MG/ML IJ SOLN
5.0000 mg | Freq: Three times a day (TID) | INTRAMUSCULAR | Status: DC | PRN
  Administered 2014-06-30: 10 mg via INTRAVENOUS
  Filled 2014-06-29: qty 2

## 2014-06-29 MED ORDER — FENTANYL CITRATE 0.05 MG/ML IJ SOLN
INTRAMUSCULAR | Status: DC | PRN
  Administered 2014-06-29: 100 ug via INTRAVENOUS
  Administered 2014-06-29: 50 ug via INTRAVENOUS
  Administered 2014-06-29: 25 ug via INTRAVENOUS
  Administered 2014-06-29: 75 ug via INTRAVENOUS

## 2014-06-29 MED ORDER — ONDANSETRON HCL 4 MG/2ML IJ SOLN
4.0000 mg | Freq: Four times a day (QID) | INTRAMUSCULAR | Status: DC | PRN
  Administered 2014-06-29: 4 mg via INTRAVENOUS
  Filled 2014-06-29: qty 2

## 2014-06-29 MED ORDER — METHOCARBAMOL 500 MG PO TABS
ORAL_TABLET | ORAL | Status: AC
  Filled 2014-06-29: qty 1

## 2014-06-29 MED ORDER — PROPOFOL 10 MG/ML IV BOLUS
INTRAVENOUS | Status: AC
  Filled 2014-06-29: qty 20

## 2014-06-29 MED ORDER — FENTANYL CITRATE 0.05 MG/ML IJ SOLN
INTRAMUSCULAR | Status: AC
  Filled 2014-06-29: qty 5

## 2014-06-29 MED ORDER — MIDAZOLAM HCL 2 MG/2ML IJ SOLN
INTRAMUSCULAR | Status: AC
  Filled 2014-06-29: qty 2

## 2014-06-29 MED ORDER — METOCLOPRAMIDE HCL 5 MG PO TABS: 5.0000 mg | ORAL_TABLET | Freq: Three times a day (TID) | ORAL | Status: DC | PRN

## 2014-06-29 MED ORDER — PHENYLEPHRINE HCL 10 MG/ML IJ SOLN
INTRAMUSCULAR | Status: DC | PRN
  Administered 2014-06-29: 80 ug via INTRAVENOUS
  Administered 2014-06-29 (×2): 40 ug via INTRAVENOUS
  Administered 2014-06-29 (×2): 80 ug via INTRAVENOUS

## 2014-06-29 MED ORDER — MIDAZOLAM HCL 5 MG/5ML IJ SOLN
INTRAMUSCULAR | Status: DC | PRN
  Administered 2014-06-29: 2 mg via INTRAVENOUS

## 2014-06-29 MED ORDER — BUPIVACAINE HCL 0.25 % IJ SOLN
INTRAMUSCULAR | Status: DC | PRN
  Administered 2014-06-29: 6 mL

## 2014-06-29 MED ORDER — OXYCODONE HCL 5 MG PO TABS: 5.0000 mg | ORAL_TABLET | Freq: Once | ORAL | Status: DC | PRN

## 2014-06-29 MED ORDER — CEFAZOLIN SODIUM-DEXTROSE 2-3 GM-% IV SOLR
2.0000 g | INTRAVENOUS | Status: AC
  Administered 2014-06-29: 2 g via INTRAVENOUS
  Filled 2014-06-29: qty 50

## 2014-06-29 MED ORDER — BUPIVACAINE HCL (PF) 0.25 % IJ SOLN
INTRAMUSCULAR | Status: AC
  Filled 2014-06-29: qty 30

## 2014-06-29 MED ORDER — KCL IN DEXTROSE-NACL 20-5-0.45 MEQ/L-%-% IV SOLN
INTRAVENOUS | Status: DC
  Administered 2014-06-29: 22:00:00 via INTRAVENOUS
  Filled 2014-06-29 (×3): qty 1000

## 2014-06-29 MED ORDER — PROMETHAZINE HCL 25 MG/ML IJ SOLN: 6.2500 mg | INTRAMUSCULAR | Status: DC | PRN

## 2014-06-29 MED ORDER — OXYCODONE-ACETAMINOPHEN 5-325 MG PO TABS: 1.0000 | ORAL_TABLET | ORAL | Status: AC | PRN

## 2014-06-29 MED ORDER — MORPHINE SULFATE 2 MG/ML IJ SOLN
1.0000 mg | INTRAMUSCULAR | Status: DC | PRN
  Administered 2014-06-29 – 2014-06-30 (×3): 1 mg via INTRAVENOUS
  Filled 2014-06-29 (×3): qty 1

## 2014-06-29 MED ORDER — LIDOCAINE HCL (CARDIAC) 20 MG/ML IV SOLN
INTRAVENOUS | Status: DC | PRN
  Administered 2014-06-29: 50 mg via INTRAVENOUS

## 2014-06-29 MED ORDER — OXYCODONE HCL 5 MG/5ML PO SOLN: 5.0000 mg | Freq: Once | ORAL | Status: DC | PRN

## 2014-06-29 MED ORDER — LIDOCAINE HCL (CARDIAC) 20 MG/ML IV SOLN
INTRAVENOUS | Status: AC
  Filled 2014-06-29: qty 5

## 2014-06-29 MED ORDER — METHOCARBAMOL 500 MG PO TABS: 500.0000 mg | ORAL_TABLET | Freq: Four times a day (QID) | ORAL | Status: AC | PRN

## 2014-06-29 MED ORDER — ONDANSETRON HCL 4 MG PO TABS: 4.0000 mg | ORAL_TABLET | Freq: Four times a day (QID) | ORAL | Status: DC | PRN

## 2014-06-29 SURGICAL SUPPLY — 65 items
BANDAGE ELASTIC 4 VELCRO ST LF (GAUZE/BANDAGES/DRESSINGS) IMPLANT
BANDAGE ELASTIC 6 VELCRO ST LF (GAUZE/BANDAGES/DRESSINGS) ×3 IMPLANT
BANDAGE ESMARK 6X9 LF (GAUZE/BANDAGES/DRESSINGS) IMPLANT
BIT DRILL 2.5X110 QC LCP DISP (BIT) ×2 IMPLANT
BNDG CMPR 9X6 STRL LF SNTH (GAUZE/BANDAGES/DRESSINGS)
BNDG ESMARK 6X9 LF (GAUZE/BANDAGES/DRESSINGS)
COVER MAYO STAND STRL (DRAPES) ×3 IMPLANT
COVER SURGICAL LIGHT HANDLE (MISCELLANEOUS) ×3 IMPLANT
CUFF TOURNIQUET SINGLE 34IN LL (TOURNIQUET CUFF) IMPLANT
CUFF TOURNIQUET SINGLE 44IN (TOURNIQUET CUFF) IMPLANT
DRAPE C-ARM 42X72 X-RAY (DRAPES) IMPLANT
DRAPE INCISE IOBAN 66X45 STRL (DRAPES) ×3 IMPLANT
DRAPE PROXIMA HALF (DRAPES) ×3 IMPLANT
DRAPE U-SHAPE 47X51 STRL (DRAPES) ×3 IMPLANT
DRSG PAD ABDOMINAL 8X10 ST (GAUZE/BANDAGES/DRESSINGS) ×3 IMPLANT
DURAPREP 26ML APPLICATOR (WOUND CARE) ×3 IMPLANT
ELECT REM PT RETURN 9FT ADLT (ELECTROSURGICAL) ×3
ELECTRODE REM PT RTRN 9FT ADLT (ELECTROSURGICAL) ×1 IMPLANT
GAUZE SPONGE 4X4 12PLY STRL (GAUZE/BANDAGES/DRESSINGS) ×3 IMPLANT
GAUZE XEROFORM 5X9 LF (GAUZE/BANDAGES/DRESSINGS) ×3 IMPLANT
GLOVE BIOGEL PI IND STRL 7.0 (GLOVE) ×1 IMPLANT
GLOVE BIOGEL PI IND STRL 7.5 (GLOVE) ×1 IMPLANT
GLOVE BIOGEL PI IND STRL 8 (GLOVE) ×1 IMPLANT
GLOVE BIOGEL PI INDICATOR 7.0 (GLOVE) ×2
GLOVE BIOGEL PI INDICATOR 7.5 (GLOVE) ×2
GLOVE BIOGEL PI INDICATOR 8 (GLOVE) ×2
GLOVE ECLIPSE 7.0 STRL STRAW (GLOVE) ×3 IMPLANT
GLOVE ORTHO TXT STRL SZ7.5 (GLOVE) ×3 IMPLANT
GLOVE SS PI 9.0 STRL (GLOVE) ×2 IMPLANT
GOWN STRL REUS W/ TWL LRG LVL3 (GOWN DISPOSABLE) ×2 IMPLANT
GOWN STRL REUS W/ TWL XL LVL3 (GOWN DISPOSABLE) ×1 IMPLANT
GOWN STRL REUS W/TWL LRG LVL3 (GOWN DISPOSABLE) ×6
GOWN STRL REUS W/TWL XL LVL3 (GOWN DISPOSABLE) ×3
KIT 1/3 TUB PL 6H 73M (Orthopedic Implant) IMPLANT
KIT BASIN OR (CUSTOM PROCEDURE TRAY) ×3 IMPLANT
KIT ROOM TURNOVER OR (KITS) ×3 IMPLANT
MANIFOLD NEPTUNE II (INSTRUMENTS) ×3 IMPLANT
NS IRRIG 1000ML POUR BTL (IV SOLUTION) ×3 IMPLANT
PACK ORTHO EXTREMITY (CUSTOM PROCEDURE TRAY) ×3 IMPLANT
PAD ARMBOARD 7.5X6 YLW CONV (MISCELLANEOUS) ×6 IMPLANT
PAD CAST 4YDX4 CTTN HI CHSV (CAST SUPPLIES) ×1 IMPLANT
PADDING CAST COTTON 4X4 STRL (CAST SUPPLIES) ×3
PADDING CAST COTTON 6X4 STRL (CAST SUPPLIES) ×3 IMPLANT
PROS 1/3 TUB PL 6H 73M (Orthopedic Implant) ×3 IMPLANT
SCREW CANC FT 12 4.0 (Screw) ×2 IMPLANT
SCREW CANC FT ST SFS 4X14 (Screw) ×4 IMPLANT
SCREW CORTEX 3.5 14MM (Screw) ×6 IMPLANT
SCREW CORTEX 3.5 16MM (Screw) ×2 IMPLANT
SCREW CORTEX 3.5 22MM (Screw) ×2 IMPLANT
SCREW LOCK CORT ST 3.5X14 (Screw) ×3 IMPLANT
SCREW LOCK CORT ST 3.5X16 (Screw) IMPLANT
SCREW LOCK CORT ST 3.5X22 (Screw) IMPLANT
SPONGE GAUZE 4X4 12PLY STER LF (GAUZE/BANDAGES/DRESSINGS) ×2 IMPLANT
SPONGE LAP 18X18 X RAY DECT (DISPOSABLE) ×3 IMPLANT
STAPLER VISISTAT 35W (STAPLE) IMPLANT
SUCTION FRAZIER TIP 10 FR DISP (SUCTIONS) ×3 IMPLANT
SUT ETHILON 3 0 PS 1 (SUTURE) ×6 IMPLANT
SUT VIC AB 2-0 CT1 27 (SUTURE) ×6
SUT VIC AB 2-0 CT1 TAPERPNT 27 (SUTURE) ×2 IMPLANT
TOWEL OR 17X24 6PK STRL BLUE (TOWEL DISPOSABLE) ×3 IMPLANT
TOWEL OR 17X26 10 PK STRL BLUE (TOWEL DISPOSABLE) ×3 IMPLANT
TUBE CONNECTING 12'X1/4 (SUCTIONS) ×1
TUBE CONNECTING 12X1/4 (SUCTIONS) ×2 IMPLANT
WATER STERILE IRR 1000ML POUR (IV SOLUTION) ×3 IMPLANT
YANKAUER SUCT BULB TIP NO VENT (SUCTIONS) ×3 IMPLANT

## 2014-06-29 NOTE — Discharge Instructions (Signed)
Keep short leg splint and dressing dry. May use water impervious bag or cast bag and °tub chair to shower Tape the top of bag to skin to avoid moisture soaking the dressing on °the leg. °Call if there is odor or saturation of dressing or worsening pain not controlled with medications. °Call if fever greater than 101.5. °Use crutches or walker no weight bearing on the ankle fracture leg. °Please follow up with an appointment with Dr. Yates  2 weeks from the time of surgery. °Elevate as often as possible during the first week after surgery gradually increasing the time °the leg is dependent or down there after. If swelling recurrs then elevate again. °Wheel chair for longer distances. °Take aspirin daily to prevent blood clots °

## 2014-06-29 NOTE — Anesthesia Postprocedure Evaluation (Signed)
  Anesthesia Post-op Note  Patient: Regina Simmons  Procedure(s) Performed: Procedure(s) with comments: OPEN REDUCTION INTERNAL FIXATION (ORIF) ANKLE FRACTURE (Left) - Open Reduction Internal Fixation Left Lateral Malleolus Fracture  Patient Location: PACU  Anesthesia Type:General  Level of Consciousness: awake, alert , oriented and patient cooperative  Airway and Oxygen Therapy: Patient Spontanous Breathing  Post-op Pain: moderate  Post-op Assessment: Post-op Vital signs reviewed, Patient's Cardiovascular Status Stable, Respiratory Function Stable, Patent Airway, No signs of Nausea or vomiting and Pain level controlled  Post-op Vital Signs: stable  Last Vitals:  Filed Vitals:   06/29/14 2000  BP: 114/70  Pulse: 79  Temp: 36.8 C  Resp: 12    Complications: No apparent anesthesia complications

## 2014-06-29 NOTE — Interval H&P Note (Signed)
History and Physical Interval Note:  06/29/2014 4:59 PM  Regina Simmons  has presented today for surgery, with the diagnosis of Left Ankle Fracture  The various methods of treatment have been discussed with the patient and family. After consideration of risks, benefits and other options for treatment, the patient has consented to  Procedure(s) with comments: OPEN REDUCTION INTERNAL FIXATION (ORIF) ANKLE FRACTURE (Left) - Open Reduction Internal Fixation Left Lateral Malleolus Fracture as a surgical intervention .  The patient's history has been reviewed, patient examined, no change in status, stable for surgery.  I have reviewed the patient's chart and labs.  Questions were answered to the patient's satisfaction.     Daulton Harbaugh C

## 2014-06-29 NOTE — Brief Op Note (Signed)
06/29/2014  6:53 PM  PATIENT:  Regina Simmons  38 y.o. female  PRE-OPERATIVE DIAGNOSIS:  Left Ankle Fracture  POST-OPERATIVE DIAGNOSIS:  Left Ankle Fracture  PROCEDURE:  Procedure(s) with comments: OPEN REDUCTION INTERNAL FIXATION (ORIF) ANKLE FRACTURE (Left) - Open Reduction Internal Fixation Left Lateral Malleolus Fracture  SURGEON:  Surgeon(s) and Role:    * Eldred MangesMark C Dollene Mallery, MD - Primary  PHYSICIAN ASSISTANT:   ASSISTANTS: none   ANESTHESIA:   local and general  EBL:  Total I/O In: 1000 [I.V.:1000] Out: 30 [Blood:30]  BLOOD ADMINISTERED:none  DRAINS: none   LOCAL MEDICATIONS USED:  MARCAINE     SPECIMEN:  No Specimen  DISPOSITION OF SPECIMEN:  N/A  COUNTS:  YES  TOURNIQUET:   Total Tourniquet Time Documented: Thigh (Left) - 17 minutes Total: Thigh (Left) - 17 minutes   DICTATION: .Other Dictation: Dictation Number [id number]  PLAN OF CARE: Admit for overnight observation  PATIENT DISPOSITION:  PACU - hemodynamically stable.   Delay start of Pharmacological VTE agent (>24hrs) due to surgical blood loss or risk of bleeding: not applicable

## 2014-06-29 NOTE — Op Note (Signed)
NAMGeorgiann Cocker:  Bazinet, Juliany                  ACCOUNT NO.:  [id number]  MEDICAL RECORD NO.:  [id number]  LOCATION:  [id number]                        FACILITY:  [facility]  PHYSICIAN:  Lener Ventresca C. Ophelia CharterYates, M.D.    DATE OF BIRTH:  1976-10-17  DATE OF PROCEDURE:  06/29/2014 DATE OF DISCHARGE:                              OPERATIVE REPORT   PREOPERATIVE DIAGNOSIS:  Lateral malleolar fracture with deltoid ligament rupture.  POSTOPERATIVE DIAGNOSIS:  Lateral malleolar fracture with deltoid ligament rupture.  PROCEDURE:  Open reduction and internal fixation, left lateral malleolus.  SURGEON:  Matteo Banke C. Ophelia CharterYates, M.D.  ANESTHESIA:  General.  TOURNIQUET TIME:  16 minutes.  DRAINS:  None.  COMPLICATIONS:  None.  ANESTHESIA:  General plus 6 mL Marcaine local.  DESCRIPTION OF PROCEDURE:  After induction of general anesthesia, prepping and draping, proximal thigh tourniquet.  Time-out procedure was completed.  Usual extremity sheets and drapes were applied.  The patient had lateral skin marked with marking pen.  Lateral incision was made after elevating the leg wrapping with an Esmarch, tourniquet inflation. Subperiosteal dissection, fracture reduction held with self-retaining clamp.  A 6-hole one-third tubular plate, Synthes applied laterally, 2 distal screws were placing cortical 12 and 14 mm fully threaded cancellous and proximally bicortical screws were used to fill the proximal 3 holes.  The third hole from the bottom was left open since it was at the level of fracture site and the lag screw was placed from anterior, proximal to distal posterior which was 22 mm in length, lagging the fracture site compressing it.  Final spot x-rays were taken. AP and lateral mortise which confirmed excellent position and alignment. The patient tolerated the procedure well.  Transferred to the recovery room in stable condition for observation.     Jackye Dever C. Ophelia CharterYates, M.D.    MCY/MEDQ  D:  06/29/2014  T:  06/29/2014  Job:   [id number]

## 2014-06-29 NOTE — Transfer of Care (Signed)
Immediate Anesthesia Transfer of Care Note  Patient: Regina Simmons  Procedure(s) Performed: Procedure(s) with comments: OPEN REDUCTION INTERNAL FIXATION (ORIF) ANKLE FRACTURE (Left) - Open Reduction Internal Fixation Left Lateral Malleolus Fracture  Patient Location: PACU  Anesthesia Type:General  Level of Consciousness: awake and alert   Airway & Oxygen Therapy: Patient Spontanous Breathing and Patient connected to nasal cannula oxygen  Post-op Assessment: Report given to PACU RN and Post -op Vital signs reviewed and stable  Post vital signs: Reviewed and stable  Complications: No apparent anesthesia complications

## 2014-06-29 NOTE — Anesthesia Procedure Notes (Addendum)
Procedure Name: LMA Insertion Date/Time: 06/29/2014 6:00 PM Performed by: Vita BarleyURNER, SARAH E Pre-anesthesia Checklist: Patient identified, Emergency Drugs available, Suction available and Patient being monitored Patient Re-evaluated:Patient Re-evaluated prior to inductionOxygen Delivery Method: Circle system utilized Preoxygenation: Pre-oxygenation with 100% oxygen Intubation Type: IV induction Ventilation: Mask ventilation without difficulty LMA: LMA inserted LMA Size: 4.0 Number of attempts: 1 Placement Confirmation: positive ETCO2 and breath sounds checked- equal and bilateral Tube secured with: Tape Dental Injury: Teeth and Oropharynx as per pre-operative assessment

## 2014-06-29 NOTE — Anesthesia Preprocedure Evaluation (Addendum)
Anesthesia Evaluation  Patient identified by MRN, date of birth, ID band Patient awake    Reviewed: Allergy & Precautions, H&P , NPO status , Patient's Chart, lab work & pertinent test results  Airway Mallampati: I  Neck ROM: Full    Dental  (+) Dental Advisory Given, Teeth Intact,    Pulmonary Current Smoker,  breath sounds clear to auscultation        Cardiovascular Rhythm:Regular Rate:Normal     Neuro/Psych    GI/Hepatic GERD-  ,  Endo/Other    Renal/GU      Musculoskeletal   Abdominal   Peds  Hematology   Anesthesia Other Findings   Reproductive/Obstetrics                       Anesthesia Physical Anesthesia Plan  ASA: II  Anesthesia Plan: General   Post-op Pain Management:    Induction: Intravenous  Airway Management Planned: LMA  Additional Equipment:   Intra-op Plan:   Post-operative Plan: Extubation in OR  Informed Consent: I have reviewed the patients History and Physical, chart, labs and discussed the procedure including the risks, benefits and alternatives for the proposed anesthesia with the patient or authorized representative who has indicated his/her understanding and acceptance.   Dental advisory given  Plan Discussed with: CRNA, Anesthesiologist and Surgeon  Anesthesia Plan Comments:       Anesthesia Quick Evaluation

## 2014-06-30 ENCOUNTER — Encounter (HOSPITAL_COMMUNITY): Payer: Self-pay

## 2014-06-30 DIAGNOSIS — S8263XA Displaced fracture of lateral malleolus of unspecified fibula, initial encounter for closed fracture: Secondary | ICD-10-CM | POA: Diagnosis not present

## 2014-06-30 MED ORDER — PNEUMOCOCCAL VAC POLYVALENT 25 MCG/0.5ML IJ INJ: 0.5000 mL | INJECTION | INTRAMUSCULAR | Status: DC

## 2014-06-30 NOTE — Progress Notes (Signed)
[facility] in building at 1515pm.  Rolling walker, 3-in-1 and wheelchair w/ elevated leg rests.

## 2014-06-30 NOTE — Evaluation (Addendum)
Physical Therapy Evaluation Patient Details Name: Regina Simmons MRN: [id number] DOB: 16-Nov-1976 Today's Date: 06/30/2014   History of Present Illness  38 year old female who injured her left ankle while playing with her daughter at home on 06/25/2014. S/p ORIF Lt Lateral maleolus.   Clinical Impression  Pt adm due to above. Presents with decreased independence with functional mobility secondary to deficits indicated below. Pt was able to ambulate short distance with RW at supervision level and was able to perform SPT at supervision to mod I level. Pt is safe from mobility standpoint to D/C home today with 24/7 (A) and appropriate equipment listed below. If pt stays for pain control, acute PT will cont to follow to progress mobility and independence. Pt will require wheelchair with elevated leg rest for incr mobility.     Follow Up Recommendations No PT follow up;Supervision/Assistance - 24 hour;Other (comment) (OPPT when WB status is updated)    Equipment Recommendations  Rolling walker with 5" wheels;3in1 (PT);Wheelchair (measurements PT);Wheelchair cushion (measurements PT);Other (comment) (wheelchair with elevated leg rest; pt is 4'11")    Recommendations for Other Services       Precautions / Restrictions Precautions Precautions: None Restrictions Weight Bearing Restrictions: Yes LLE Weight Bearing: Non weight bearing      Mobility  Bed Mobility Overal bed mobility: Modified Independent             General bed mobility comments: incr time due to pain   Transfers Overall transfer level: Needs assistance Equipment used: Rolling walker (2 wheeled) Transfers: Sit to/from [employer] Transfers Sit to Stand: Supervision Stand pivot transfers: Supervision       General transfer comment: supervision for cues for safety and hand placement on RW; pt able to perform SPT to [facility] at supervision level for safety  Ambulation/Gait Ambulation/Gait assistance: Min  guard;Supervision Ambulation Distance (Feet): 20 Feet Assistive device: Rolling walker (2 wheeled) (knee walker ) Gait Pattern/deviations: Step-to pattern Gait velocity: decr Gait velocity interpretation: Below normal speed for age/gender General Gait Details: attempted ambulating with knee walker; pt demo poor balance and control with knee walker; was able to progress to supervision when ambulating with RW; limited distance due to pain and fatigue; will require wheelchair for incr distances and to return to wor; demo good ability and understanding to maintain NWB status   Stairs            Wheelchair Mobility    Modified Rankin (Stroke Patients Only)       Balance Overall balance assessment: Needs assistance Sitting-balance support: Feet supported;No upper extremity supported Sitting balance-Leahy Scale: Good     Standing balance support: During functional activity;Bilateral upper extremity supported;No upper extremity supported Standing balance-Leahy Scale: Good Standing balance comment: able to stand with support of UEs and without for short periods of time; pt able to perform pericare at [facility] without LOB                              Pertinent Vitals/Pain 4/10 at rest; 10/10 with activity in lateral aspect of ankle; patient repositioned for comfort with LE elevated; pain described as "throbbing"    Home Living Family/patient expects to be discharged to:: Private residence Living Arrangements: Spouse/significant other;Parent Available Help at Discharge: Family;Available 24 hours/day Type of Home: House Home Access: Level entry Entrance Stairs-Rails: Left;Right;Can reach both   Home Layout: Able to live on main level with bedroom/bathroom Home Equipment: Crutches Additional Comments: pt has been  using crutches since fall satruday; reports they hurt her arms; mother planning to come stay with pt to (A) with young children when husband works; pt reports she  does not have steps at her house     Prior Function Level of Independence: Independent               Hand Dominance        Extremity/Trunk Assessment   Upper Extremity Assessment: Overall WFL for tasks assessed           Lower Extremity Assessment: LLE deficits/detail   LLE Deficits / Details: Lt ankle immobilized  Cervical / Trunk Assessment: Normal  Communication   Communication: No difficulties  Cognition Arousal/Alertness: Awake/alert Behavior During Therapy: WFL for tasks assessed/performed Overall Cognitive Status: Within Functional Limits for tasks assessed                      General Comments General comments (skin integrity, edema, etc.): discussed at length D/C recommendations; answered questions regarding home setup and safety with equipment     Exercises        Assessment/Plan    PT Assessment Patient needs continued PT services  PT Diagnosis Difficulty walking;Generalized weakness;Acute pain   PT Problem List Decreased strength;Decreased activity tolerance;Decreased mobility;Decreased balance;Decreased knowledge of use of DME;Pain  PT Treatment Interventions DME instruction;Gait training;Functional mobility training;Therapeutic activities;Therapeutic exercise;Balance training;Neuromuscular re-education;Patient/family education;Wheelchair mobility training   PT Goals (Current goals can be found in the Care Plan section) Acute Rehab PT Goals Patient Stated Goal: to go back to work and play with my kids PT Goal Formulation: With patient Time For Goal Achievement: 07/02/14 Potential to Achieve Goals: Good    Frequency Min 4X/week   Barriers to discharge        -o-evaluation               End of Session Equipment Utilized During Treatment: Gait belt Activity Tolerance: Patient limited by fatigue Patient left: with call bell/phone within reach;in bed Nurse Communication: Mobility status;Other (comment) (DME needs for D/C)     Functional Assessment Tool Used: clinical judgement Functional Limitation: Mobility: Walking and moving around Mobility: Walking and Moving Around Current Status [phone](G8978): At least 1 percent but less than 20 percent impaired, limited or restricted Mobility: Walking and Moving Around Goal Status [phone](G8979): 0 percent impaired, limited or restricted Mobility: Walking and Moving Around Discharge Status [phone](G8980): At least 1 percent but less than 20 percent impaired, limited or restricted    Time: 1003-1030 PT Time Calculation (min): 27 min   Charges:   PT Evaluation $Initial PT Evaluation Tier I: 1 Procedure PT Treatments $Gait Training: 8-22 mins $Therapeutic Activity: 8-22 mins   PT G Codes:   Functional Assessment Tool Used: clinical judgement Functional Limitation: Mobility: Walking and moving around    [place], PancoastburgBrittany N, [place]PT  [phone] 06/30/2014, 11:16 AM

## 2014-06-30 NOTE — Progress Notes (Signed)
   Subjective:  Patient reports pain as marked.  No events.  Objective:   VITALS:   Filed Vitals:   06/29/14 2000 06/29/14 2100 06/30/14 0205 06/30/14 0523  BP: 114/70 115/74 104/74 109/86  Pulse: 79 76 82 78  Temp: 98.2 F (36.8 C) 98.3 F (36.8 C) 98.3 F (36.8 C) 98.4 F (36.9 C)  TempSrc:  Oral  Oral  Resp: 12 16 12 16   Height:      Weight:      SpO2: 98% 100% 93% 91%    Neurologically intact Neurovascular intact Sensation intact distally Intact pulses distally Incision: dressing C/D/I and no drainage No cellulitis present Compartment soft   Lab Results  Component Value Date   WBC 6.9 06/29/2014   HGB 13.8 06/29/2014   HCT 41.5 06/29/2014   MCV 92.4 06/29/2014   PLT 246 06/29/2014     Assessment/Plan:  1 Day Post-Op   - Expected postop acute blood loss anemia - will monitor for symptoms - Up with PT/OT - DVT ppx - SCDs, ambulation - NWB left lower extremity - Pain control - Discharge planning - home today after PT  Cheral AlmasXu, Naiping Michael 06/30/2014, 7:28 AM [phone]

## 2014-06-30 NOTE — Progress Notes (Signed)
UR completed 

## 2014-06-30 NOTE — Discharge Planning (Signed)
Copy of AVS to pt who verbalizes understanding, also given rx x 2.  DME equipment delivered to room for home use. Discharged per w/c with all personal belongings to private car home, acomp. By husband at [id number].

## 2014-07-01 NOTE — Discharge Summary (Signed)
Physician Discharge Summary      Patient ID: Regina Simmons MRN: [id number] DOB/AGE: 1976/06/02 38 y.o.  Admit date: Jul 23, 2014 Discharge date: 07/01/2014  Admission Diagnoses:  Ankle fracture, left  Discharge Diagnoses:  Principal Problem:   Ankle fracture, left Active Problems:   Ankle fracture   Past Medical History  Diagnosis Date  . Back pain   . GERD (gastroesophageal reflux disease)   . Ankle fracture, left     Surgeries: Procedure(s): OPEN REDUCTION INTERNAL FIXATION (ORIF) ANKLE FRACTURE on July 23, 2014   Consultants (if any):    Discharged Condition: Improved  Hospital Course: Regina Simmons is an 38 y.o. female who was admitted July 23, 2014 with a diagnosis of Ankle fracture, left and went to the operating room on 07-23-2014 and underwent the above named procedures.    She was given perioperative antibiotics:      Anti-infectives   Start     Dose/Rate Route Frequency Ordered Stop   07/23/2014 1615  ceFAZolin (ANCEF) IVPB 2 g/50 mL premix     2 g 100 mL/hr over 30 Minutes Intravenous On call to O.R. 07/23/14 1544 23-Jul-2014 1802    .  She was given sequential compression devices, early ambulation and aspirin for DVT prophylaxis.  She benefited maximally from the hospital stay and there were no complications.    Recent vital signs:  Filed Vitals:   06/30/14 1345  BP: 125/64  Pulse: 87  Temp: 98.5 F (36.9 C)  Resp: 16    Recent laboratory studies:  Lab Results  Component Value Date   HGB 13.8 2014/07/23   HGB 11.1* 01/02/2011   HGB 12.5 12/25/2010   Lab Results  Component Value Date   WBC 6.9 23-Jul-2014   PLT 246 July 23, 2014   No results found for this basename: INR   Lab Results  Component Value Date   NA 138 23-Jul-2014   K 3.8 07/23/2014   CL 101 Jul 23, 2014   CO2 26 23-Jul-2014   BUN 9 07-23-2014   CREATININE 0.77 2014/07/23   GLUCOSE 84 07/23/2014    Discharge Medications:     Medication List         aspirin EC 325 MG tablet  Take 1 tablet (325 mg total) by  mouth daily.     methocarbamol 500 MG tablet  Commonly known as:  ROBAXIN  Take 1 tablet (500 mg total) by mouth every 6 (six) hours as needed for muscle spasms (spasm).     oxyCODONE-acetaminophen 5-325 MG per tablet  Commonly known as:  PERCOCET  Take 1-2 tablets by mouth every 6 (six) hours as needed for severe pain.     oxyCODONE-acetaminophen 5-325 MG per tablet  Commonly known as:  ROXICET  Take 1-2 tablets by mouth every 4 (four) hours as needed.        Diagnostic Studies: Dg Ankle Complete Left  23-Jul-2014   CLINICAL DATA:  Ankle fracture.  EXAM: LEFT ANKLE COMPLETE - 3+ VIEW; DG C-ARM 61-120 MIN  COMPARISON:  06/25/2014  FINDINGS: Three C-arm images demonstrate the patient has undergone open reduction and internal fixation of the distal left fibula fracture. Side plate and multiple screws have been inserted. Ankle mortise has been restored. Small avulsion from the medial aspect of the talus is noted.  IMPRESSION: Open reduction and internal fixation of distal fibula fracture.   Electronically Signed   By: Geanie Cooley M.D.   On: July 23, 2014 19:01   Dg Ankle Complete Left  06/25/2014   CLINICAL DATA:  Fall, painful swollen ankle  EXAM: LEFT ANKLE COMPLETE - 3+ VIEW  COMPARISON:  None.  FINDINGS: There is an acute, minimally displaced obliquely oriented fracture through the distal fibula at the level of the tibial plafond. There is widening of the medial tibiotalar clear space to 5 mm with slight lateral translation of the talus with respect to the distal tibia. Additionally, there is a small linear bone fragment adjacent to the medial talus. Findings are most consistent with avulsion fracture from the insertion of the deltoid ligaments and resultant medial joint instability. On the lateral view there are bony debris posterior to the distal tibia. These appear well corticated. A small posterior tibial fracture is not entirely excluded.  IMPRESSION: 1. Complex ankle fracture with medial  instability and subluxation as detailed below. 2. Obliquely oriented fracture through the distal fibula at the level of the tibial plafond. 3. Avulsion fracture from the medial aspect of the talus at the insertion of the deltoid ligaments 4. Widening of the medial tibiotalar clear space with lateral subluxation of the talus. 5. Bony fragments posterior to the distal tibia appear well corticated. However, given the other acute injuries a small posterior tibial fracture is not entirely excluded. Alternately, this may represent sequela of a prior injury.   Electronically Signed   By: Malachy MoanHeath  McCullough M.D.   On: 06/25/2014 07:38   Dg C-arm 1-60 Min  06/29/2014   CLINICAL DATA:  Ankle fracture.  EXAM: LEFT ANKLE COMPLETE - 3+ VIEW; DG C-ARM 61-120 MIN  COMPARISON:  06/25/2014  FINDINGS: Three C-arm images demonstrate the patient has undergone open reduction and internal fixation of the distal left fibula fracture. Side plate and multiple screws have been inserted. Ankle mortise has been restored. Small avulsion from the medial aspect of the talus is noted.  IMPRESSION: Open reduction and internal fixation of distal fibula fracture.   Electronically Signed   By: Geanie CooleyJim  Maxwell M.D.   On: 06/29/2014 19:01    Disposition: 01-Home or Self Care  Discharge Instructions   Call MD / Call 911    Complete by:  As directed   If you experience chest pain or shortness of breath, CALL 911 and be transported to the hospital emergency room.  If you develope a fever above 101.5 F, pus (white drainage) or increased drainage or redness at the wound, or calf pain, call your surgeon's office.     Constipation Prevention    Complete by:  As directed   Drink plenty of fluids.  Prune juice may be helpful.  You may use a stool softener, such as Colace (over the counter) 100 mg twice a day.  Use MiraLax (over the counter) for constipation as needed.     Diet - low sodium heart healthy    Complete by:  As directed      Diet general     Complete by:  As directed      Driving restrictions    Complete by:  As directed   No driving while taking narcotic pain meds.     Increase activity slowly as tolerated    Complete by:  As directed      Non weight bearing    Complete by:  As directed   Laterality:  left  Extremity:  Lower           Follow-up Information   Follow up with YATES,MARK C, MD. Schedule an appointment as soon as possible for a visit in 2 weeks.   Specialty:  Orthopedic Surgery   Contact information:   [address] [place] [address] [phone]        Signed: Cheral Almas 07/01/2014, 1:49 PM

## 2015-01-10 IMAGING — RF DG ANKLE COMPLETE 3+V*L*
1 series · 4 of 4 positions shown · non-contrast
Comparison: 06/25/2014

CLINICAL DATA: Ankle fracture.

EXAM:
LEFT ANKLE COMPLETE - 3+ VIEW; DG C-ARM 61-120 MIN

[Series 1: run · 4 of 4 slices shown]
[im 1/4]
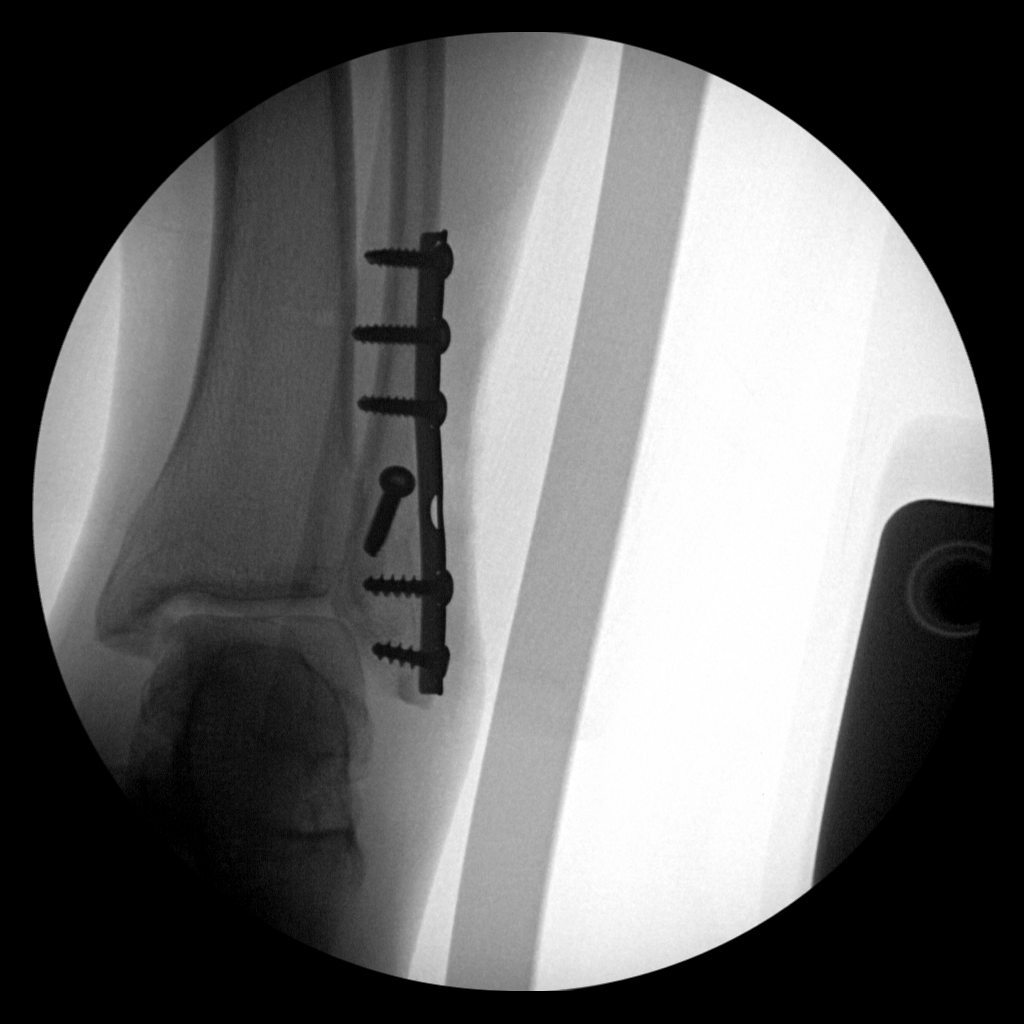
[im 2/4]
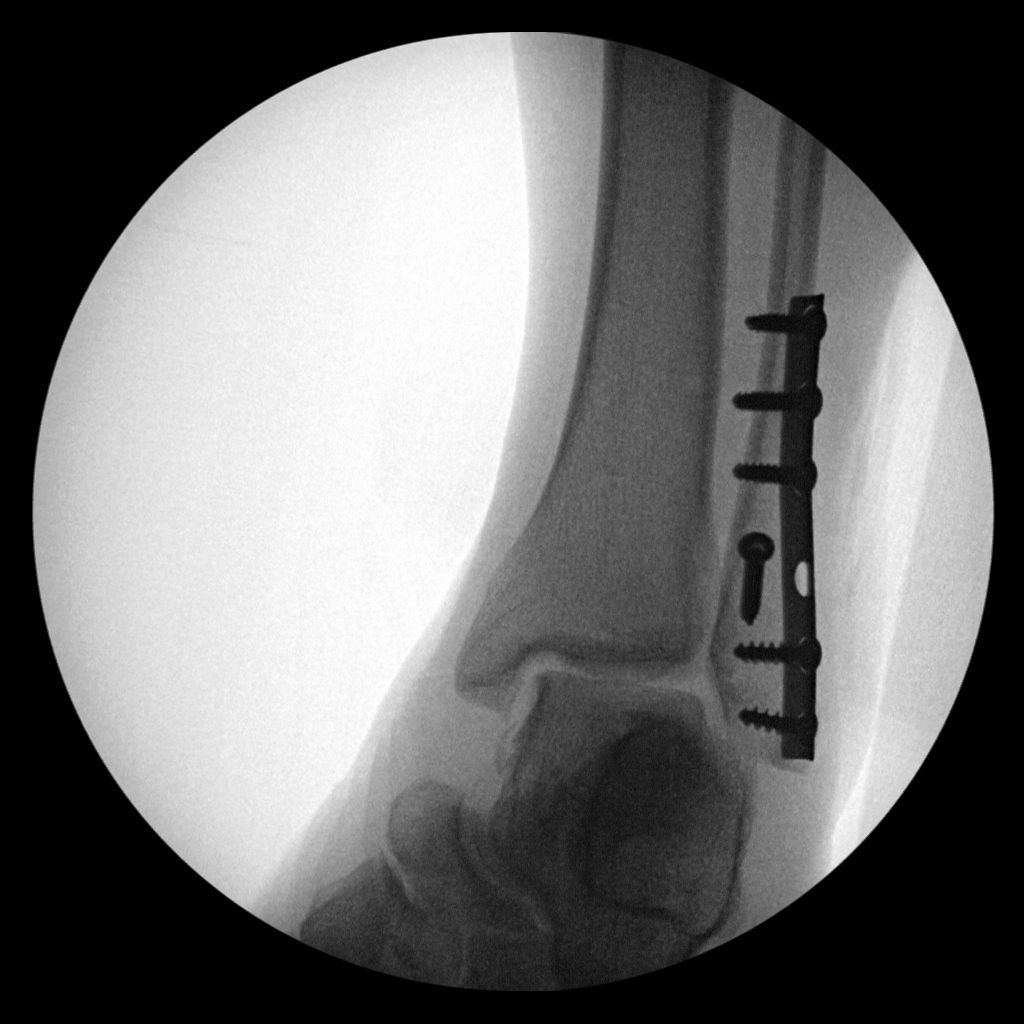
[im 3/4]
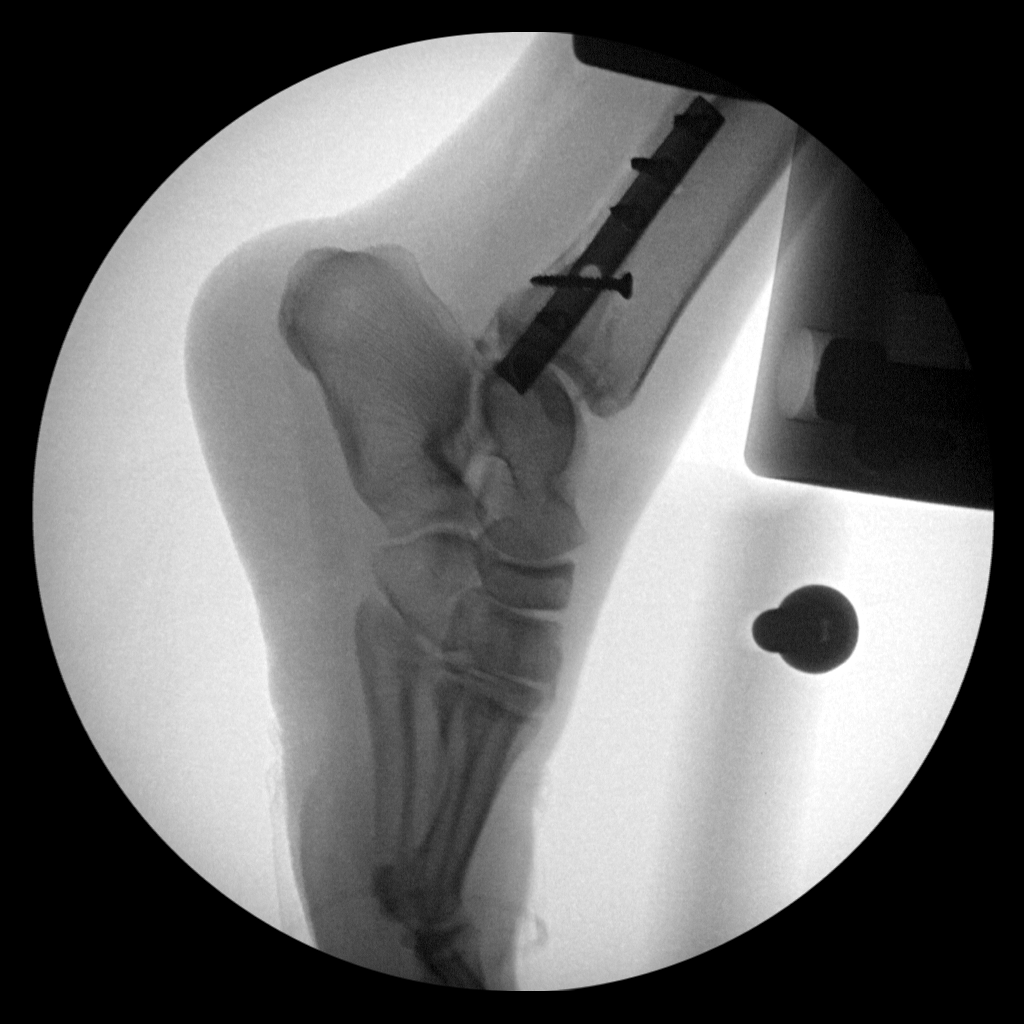
[im 4/4]
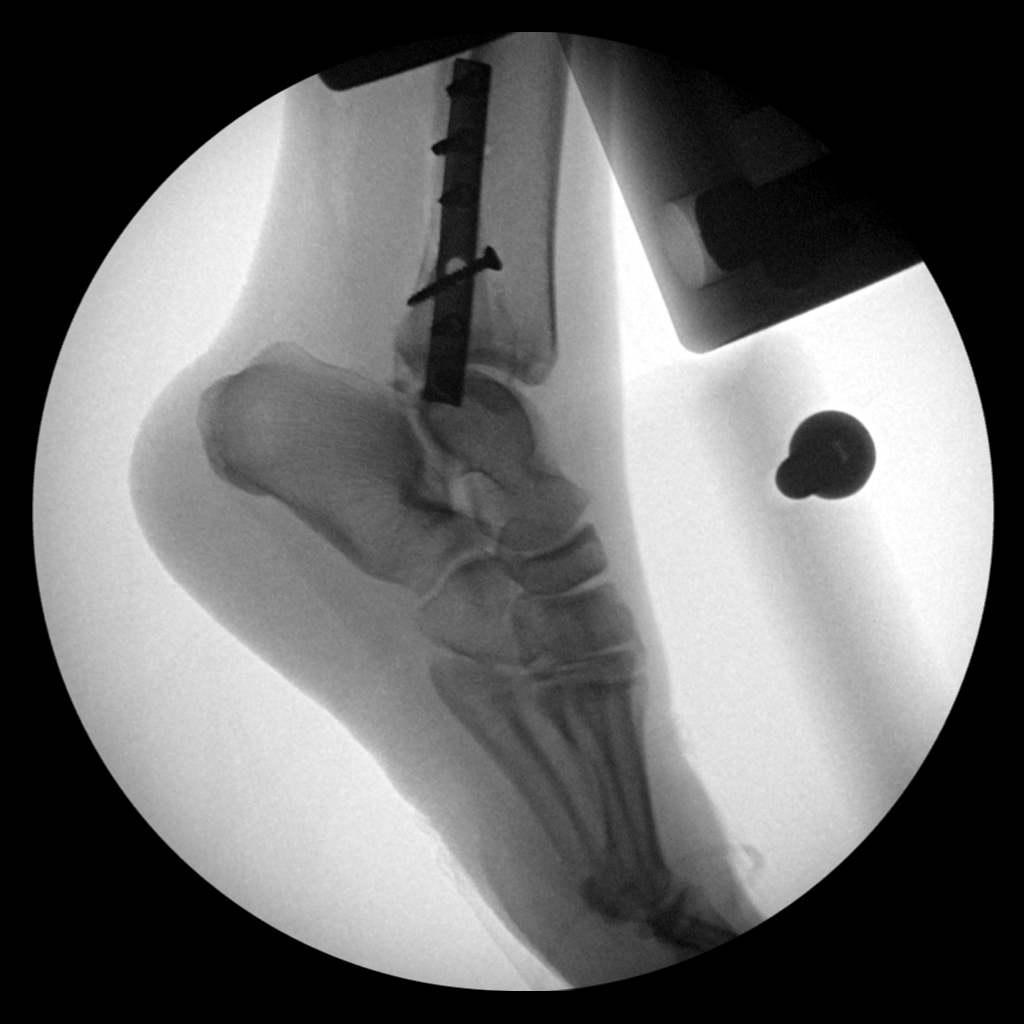

[4 of 4 positions shown; findings below may reference images not displayed]

FINDINGS: Three C-arm images demonstrate the patient has undergone open
reduction and internal fixation of the distal left fibula fracture.
Side plate and multiple screws have been inserted. Ankle mortise has
been restored. Small avulsion from the medial aspect of the talus is
noted.
IMPRESSION: Open reduction and internal fixation of distal fibula fracture.

## 2016-09-23 ENCOUNTER — Encounter: Payer: Self-pay | Admitting: Family Medicine

## 2016-09-25 ENCOUNTER — Other Ambulatory Visit: Payer: Self-pay | Admitting: Obstetrics and Gynecology

## 2016-09-27 LAB — CYTOLOGY - PAP

## 2017-03-31 ENCOUNTER — Encounter: Payer: Self-pay | Admitting: Emergency Medicine

## 2017-03-31 ENCOUNTER — Emergency Department
Admission: EM | Admit: 2017-03-31 | Discharge: 2017-03-31 | Disposition: A | Discharge status: Home / Self Care | Payer: Medicaid Other | Attending: Emergency Medicine | Admitting: Emergency Medicine

## 2017-03-31 DIAGNOSIS — S3992XA Unspecified injury of lower back, initial encounter: Secondary | ICD-10-CM | POA: Diagnosis present

## 2017-03-31 DIAGNOSIS — Y999 Unspecified external cause status: Secondary | ICD-10-CM | POA: Diagnosis not present

## 2017-03-31 DIAGNOSIS — Z7982 Long term (current) use of aspirin: Secondary | ICD-10-CM | POA: Diagnosis not present

## 2017-03-31 DIAGNOSIS — Y939 Activity, unspecified: Secondary | ICD-10-CM | POA: Diagnosis not present

## 2017-03-31 DIAGNOSIS — Y92410 Unspecified street and highway as the place of occurrence of the external cause: Secondary | ICD-10-CM | POA: Diagnosis not present

## 2017-03-31 DIAGNOSIS — F17210 Nicotine dependence, cigarettes, uncomplicated: Secondary | ICD-10-CM | POA: Insufficient documentation

## 2017-03-31 DIAGNOSIS — S39012A Strain of muscle, fascia and tendon of lower back, initial encounter: Secondary | ICD-10-CM | POA: Diagnosis not present

## 2017-03-31 MED ORDER — METHOCARBAMOL 750 MG PO TABS: 750.0000 mg | ORAL_TABLET | Freq: Four times a day (QID) | ORAL | 0 refills | Status: AC

## 2017-03-31 MED ORDER — TRAMADOL HCL 50 MG PO TABS: 50.0000 mg | ORAL_TABLET | Freq: Four times a day (QID) | ORAL | 0 refills | Status: AC | PRN

## 2017-03-31 MED ORDER — IBUPROFEN 600 MG PO TABS: 600.0000 mg | ORAL_TABLET | Freq: Three times a day (TID) | ORAL | 0 refills | Status: AC | PRN

## 2017-03-31 MED ORDER — TRAMADOL HCL 50 MG PO TABS
50.0000 mg | ORAL_TABLET | Freq: Once | ORAL | Status: DC
  Filled 2017-03-31: qty 1

## 2017-03-31 MED ORDER — CYCLOBENZAPRINE HCL 10 MG PO TABS
10.0000 mg | ORAL_TABLET | Freq: Once | ORAL | Status: AC
  Administered 2017-03-31: 10 mg via ORAL
  Filled 2017-03-31: qty 1

## 2017-03-31 MED ORDER — IBUPROFEN 600 MG PO TABS
600.0000 mg | ORAL_TABLET | Freq: Once | ORAL | Status: AC
  Administered 2017-03-31: 600 mg via ORAL
  Filled 2017-03-31: qty 1
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
## 2019-09-14 ENCOUNTER — Other Ambulatory Visit: Payer: Self-pay

## 2019-09-14 DIAGNOSIS — Z20822 Contact with and (suspected) exposure to covid-19: Secondary | ICD-10-CM

## 2019-09-15 LAB — NOVEL CORONAVIRUS, NAA: SARS-CoV-2, NAA: NOT DETECTED

## 2020-05-11 DIAGNOSIS — Z0000 Encounter for general adult medical examination without abnormal findings: Secondary | ICD-10-CM | POA: Diagnosis not present

## 2020-05-11 DIAGNOSIS — Z1331 Encounter for screening for depression: Secondary | ICD-10-CM | POA: Diagnosis not present

## 2020-05-11 DIAGNOSIS — I340 Nonrheumatic mitral (valve) insufficiency: Secondary | ICD-10-CM | POA: Diagnosis not present

## 2020-05-11 DIAGNOSIS — E7800 Pure hypercholesterolemia, unspecified: Secondary | ICD-10-CM | POA: Diagnosis not present

## 2020-05-11 DIAGNOSIS — Z716 Tobacco abuse counseling: Secondary | ICD-10-CM | POA: Diagnosis not present

## 2020-05-11 DIAGNOSIS — K219 Gastro-esophageal reflux disease without esophagitis: Secondary | ICD-10-CM | POA: Diagnosis not present

## 2020-05-25 DIAGNOSIS — Z419 Encounter for procedure for purposes other than remedying health state, unspecified: Secondary | ICD-10-CM | POA: Diagnosis not present

## 2020-06-01 DIAGNOSIS — S63502A Unspecified sprain of left wrist, initial encounter: Secondary | ICD-10-CM | POA: Diagnosis not present

## 2020-06-01 DIAGNOSIS — G5603 Carpal tunnel syndrome, bilateral upper limbs: Secondary | ICD-10-CM | POA: Diagnosis not present

## 2020-06-13 DIAGNOSIS — G5603 Carpal tunnel syndrome, bilateral upper limbs: Secondary | ICD-10-CM | POA: Diagnosis not present

## 2020-06-25 DIAGNOSIS — Z419 Encounter for procedure for purposes other than remedying health state, unspecified: Secondary | ICD-10-CM | POA: Diagnosis not present

## 2020-08-10 DIAGNOSIS — E7800 Pure hypercholesterolemia, unspecified: Secondary | ICD-10-CM | POA: Diagnosis not present

## 2020-09-25 DIAGNOSIS — Z419 Encounter for procedure for purposes other than remedying health state, unspecified: Secondary | ICD-10-CM | POA: Diagnosis not present

## 2020-10-25 DIAGNOSIS — Z419 Encounter for procedure for purposes other than remedying health state, unspecified: Secondary | ICD-10-CM | POA: Diagnosis not present

## 2020-11-03 DIAGNOSIS — Z1231 Encounter for screening mammogram for malignant neoplasm of breast: Secondary | ICD-10-CM | POA: Diagnosis not present

## 2020-11-25 DIAGNOSIS — Z419 Encounter for procedure for purposes other than remedying health state, unspecified: Secondary | ICD-10-CM | POA: Diagnosis not present

## 2020-12-26 DIAGNOSIS — Z419 Encounter for procedure for purposes other than remedying health state, unspecified: Secondary | ICD-10-CM | POA: Diagnosis not present

## 2021-01-23 DIAGNOSIS — Z419 Encounter for procedure for purposes other than remedying health state, unspecified: Secondary | ICD-10-CM | POA: Diagnosis not present

## 2021-02-23 DIAGNOSIS — Z419 Encounter for procedure for purposes other than remedying health state, unspecified: Secondary | ICD-10-CM | POA: Diagnosis not present

## 2021-03-25 DIAGNOSIS — Z419 Encounter for procedure for purposes other than remedying health state, unspecified: Secondary | ICD-10-CM | POA: Diagnosis not present

## 2021-04-25 DIAGNOSIS — Z419 Encounter for procedure for purposes other than remedying health state, unspecified: Secondary | ICD-10-CM | POA: Diagnosis not present

## 2021-05-25 DIAGNOSIS — Z419 Encounter for procedure for purposes other than remedying health state, unspecified: Secondary | ICD-10-CM | POA: Diagnosis not present

## 2021-07-26 DIAGNOSIS — K219 Gastro-esophageal reflux disease without esophagitis: Secondary | ICD-10-CM | POA: Diagnosis not present

## 2021-07-26 DIAGNOSIS — E6609 Other obesity due to excess calories: Secondary | ICD-10-CM | POA: Diagnosis not present

## 2021-07-26 DIAGNOSIS — Z0000 Encounter for general adult medical examination without abnormal findings: Secondary | ICD-10-CM | POA: Diagnosis not present

## 2021-07-26 DIAGNOSIS — E7800 Pure hypercholesterolemia, unspecified: Secondary | ICD-10-CM | POA: Diagnosis not present

## 2021-09-04 DIAGNOSIS — G5603 Carpal tunnel syndrome, bilateral upper limbs: Secondary | ICD-10-CM | POA: Diagnosis not present

## 2021-09-27 DIAGNOSIS — J101 Influenza due to other identified influenza virus with other respiratory manifestations: Secondary | ICD-10-CM | POA: Diagnosis not present

## 2021-09-27 DIAGNOSIS — Z03818 Encounter for observation for suspected exposure to other biological agents ruled out: Secondary | ICD-10-CM | POA: Diagnosis not present

## 2021-10-03 DIAGNOSIS — R0681 Apnea, not elsewhere classified: Secondary | ICD-10-CM | POA: Diagnosis not present

## 2021-10-03 DIAGNOSIS — Z6831 Body mass index (BMI) 31.0-31.9, adult: Secondary | ICD-10-CM | POA: Diagnosis not present

## 2021-10-03 DIAGNOSIS — E6609 Other obesity due to excess calories: Secondary | ICD-10-CM | POA: Diagnosis not present

## 2021-10-07 DIAGNOSIS — R0681 Apnea, not elsewhere classified: Secondary | ICD-10-CM | POA: Diagnosis not present
